# Patient Record
Sex: Male | Born: 1975 | Race: White | Hispanic: No | Marital: Married | Smoking: Never smoker
Health system: Southern US, Community
[De-identification: ages and names within clinical notes are randomized; demographics above are authoritative.]

## PROBLEM LIST (undated history)

## (undated) DIAGNOSIS — E785 Hyperlipidemia, unspecified: Secondary | ICD-10-CM

## (undated) DIAGNOSIS — Z8489 Family history of other specified conditions: Secondary | ICD-10-CM

## (undated) DIAGNOSIS — I639 Cerebral infarction, unspecified: Secondary | ICD-10-CM

## (undated) DIAGNOSIS — Q211 Atrial septal defect: Secondary | ICD-10-CM

---

## 2013-07-11 ENCOUNTER — Observation Stay (HOSPITAL_BASED_OUTPATIENT_CLINIC_OR_DEPARTMENT_OTHER): Payer: Commercial Managed Care - PPO

## 2013-07-11 ENCOUNTER — Observation Stay (HOSPITAL_BASED_OUTPATIENT_CLINIC_OR_DEPARTMENT_OTHER)
Admission: EM | Admit: 2013-07-11 | Discharge: 2013-07-12 | Disposition: A | Discharge status: Other Acute Inpatient Hospital | Payer: Commercial Managed Care - PPO | Attending: Emergency Medicine | Admitting: Emergency Medicine

## 2013-07-11 ENCOUNTER — Encounter (HOSPITAL_BASED_OUTPATIENT_CLINIC_OR_DEPARTMENT_OTHER): Payer: Self-pay | Admitting: Emergency Medicine

## 2013-07-11 ENCOUNTER — Emergency Department (HOSPITAL_BASED_OUTPATIENT_CLINIC_OR_DEPARTMENT_OTHER): Payer: Commercial Managed Care - PPO

## 2013-07-11 DIAGNOSIS — R5381 Other malaise: Secondary | ICD-10-CM | POA: Insufficient documentation

## 2013-07-11 DIAGNOSIS — R209 Unspecified disturbances of skin sensation: Secondary | ICD-10-CM | POA: Insufficient documentation

## 2013-07-11 DIAGNOSIS — R2990 Unspecified symptoms and signs involving the nervous system: Secondary | ICD-10-CM | POA: Diagnosis present

## 2013-07-11 DIAGNOSIS — R29818 Other symptoms and signs involving the nervous system: Secondary | ICD-10-CM | POA: Insufficient documentation

## 2013-07-11 DIAGNOSIS — R42 Dizziness and giddiness: Principal | ICD-10-CM | POA: Insufficient documentation

## 2013-07-11 LAB — CBC WITH DIFFERENTIAL/PLATELET
Basophils Absolute: 0 10*3/uL (ref 0.0–0.1)
Basophils Relative: 0 % (ref 0–1)
Eosinophils Absolute: 0.1 10*3/uL (ref 0.0–0.7)
Eosinophils Relative: 1 % (ref 0–5)
HCT: 48.5 % (ref 39.0–52.0)
Lymphocytes Relative: 20 % (ref 12–46)
Lymphs Abs: 2.6 10*3/uL (ref 0.7–4.0)
MCV: 85.2 fL (ref 78.0–100.0)
Monocytes Absolute: 1 10*3/uL (ref 0.1–1.0)
Platelets: 207 10*3/uL (ref 150–400)
RDW: 13.9 % (ref 11.5–15.5)
WBC: 12.9 10*3/uL — ABNORMAL HIGH (ref 4.0–10.5)

## 2013-07-11 LAB — URINALYSIS, ROUTINE W REFLEX MICROSCOPIC
Bilirubin Urine: NEGATIVE
Glucose, UA: NEGATIVE mg/dL
Hgb urine dipstick: NEGATIVE
Nitrite: NEGATIVE
Protein, ur: NEGATIVE mg/dL
Urobilinogen, UA: 1 mg/dL (ref 0.0–1.0)

## 2013-07-11 LAB — COMPREHENSIVE METABOLIC PANEL
ALT: 30 U/L (ref 0–53)
Albumin: 3.8 g/dL (ref 3.5–5.2)
CO2: 29 mEq/L (ref 19–32)
Calcium: 9.1 mg/dL (ref 8.4–10.5)
Creatinine, Ser: 1 mg/dL (ref 0.50–1.35)
GFR calc Af Amer: >90 mL/min (ref >90)
GFR calc non Af Amer: >90 mL/min (ref >90)
Glucose, Bld: 104 mg/dL — ABNORMAL HIGH (ref 70–99)
Sodium: 144 mEq/L (ref 135–145)

## 2013-07-11 MED ORDER — POTASSIUM CHLORIDE 20 MEQ/15ML (10%) PO LIQD
20.0000 meq | Freq: Once | ORAL | Status: AC
  Administered 2013-07-11: 20 meq via ORAL
  Filled 2013-07-11: qty 15

## 2013-07-11 MED ORDER — POTASSIUM CHLORIDE CRYS ER 20 MEQ PO TBCR
EXTENDED_RELEASE_TABLET | ORAL | Status: AC
  Filled 2013-07-11: qty 1

## 2013-07-11 NOTE — ED Notes (Signed)
Pt reports that he was dizzy-dx with vertigo yesterday at PCP.  Pt dehydrated-given of fluid.  Reports dizziness has continued.  Also states that his left arm and leg have been weak.  PERRLA.  Pt does have some difficulty with left arm ataxia. Grips equal.

## 2013-07-11 NOTE — ED Provider Notes (Signed)
CSN: [id number]     Arrival date & time 07/11/13  1453 History   First MD Initiated Contact with Patient 07/11/13 2024     Chief Complaint  Patient presents with  . Weakness   (Consider location/radiation/quality/duration/timing/severity/associated sxs/prior Treatment) Patient is a 37 y.o. male presenting with weakness. The history is provided by the patient.  Weakness This is a new problem. The current episode started today. The problem occurs constantly. The problem has been unchanged. Associated symptoms include fatigue, vertigo and weakness. Pertinent negatives include no abdominal pain, anorexia, change in bowel habit, chest pain, chills, congestion, coughing, diaphoresis, fever, headaches, joint swelling, myalgias, nausea (yesterday), neck pain, numbness, rash, sore throat, swollen glands, urinary symptoms or visual change. Vomiting: yesterday. The symptoms are aggravated by standing and walking.   Eder Macek is a 37 y.o. male who pesents to the ED with feeling weak. He went to his PCP yesterday with vertigo and vomiting. He was given IV fluids and Antivert. He was also prescribed Zofran for nausea. Started Zithromax today because his WBC was elevated yesterday. Patient's wife states that he has been unsteady today and she and her son had to help him in the shower and dress him because he was unable to do it himself.   History reviewed. No pertinent past medical history. History reviewed. No pertinent past surgical history. History reviewed. No pertinent family history. History  Substance Use Topics  . Smoking status: Never Smoker   . Smokeless tobacco: Not on file  . Alcohol Use: No    Review of Systems  Constitutional: Positive for fatigue. Negative for fever, chills, diaphoresis and appetite change.  HENT: Negative for congestion, ear pain and sore throat.   Eyes: Negative for photophobia, discharge, redness and itching.  Respiratory: Negative for cough.   Cardiovascular:  Negative for chest pain.  Gastrointestinal: Negative for nausea (yesterday), abdominal pain, anorexia and change in bowel habit. Vomiting: yesterday.  Genitourinary: Negative for dysuria, urgency and frequency.  Musculoskeletal: Negative for joint swelling, myalgias and neck pain.  Skin: Negative for rash.  Neurological: Positive for vertigo and weakness. Negative for numbness and headaches.  Psychiatric/Behavioral: Negative for confusion. The patient is not nervous/anxious.     Allergies  Review of patient's allergies indicates no known allergies.  Home Medications   Current Outpatient Rx  Name  Route  Sig  Dispense  Refill  . azithromycin (ZITHROMAX) 250 MG tablet   Oral   Take 250 mg by mouth daily.         . meclizine (ANTIVERT) 25 MG tablet   Oral   Take 25 mg by mouth 3 (three) times daily as needed for dizziness.         . ondansetron (ZOFRAN) 8 MG tablet   Oral   Take by mouth every 8 (eight) hours as needed for nausea or vomiting.          BP 127/79  Pulse 95  Temp(Src) 98.5 F (36.9 C) (Oral)  Resp 18  Ht 6\' 1"  (1.854 m)  Wt 254 lb (115.214 kg)  BMI 33.52 kg/m2  SpO2 98% Physical Exam  Nursing note and vitals reviewed. Constitutional: He is oriented to person, place, and time. He appears well-developed and well-nourished. No distress.  HENT:  Head: Normocephalic and atraumatic.  Right Ear: Tympanic membrane is not perforated and not erythematous.  Left Ear: Tympanic membrane is not perforated and not erythematous.  Nose: Nose normal.  Mouth/Throat: Uvula is midline, oropharynx is clear and moist and  mucous membranes are normal.  Eyes: Conjunctivae and EOM are normal. Pupils are equal, round, and reactive to light.  2 beats nystagmus with right gaze   Neck: Neck supple.  Cardiovascular: Normal rate and regular rhythm.   Pulmonary/Chest: Effort normal and breath sounds normal.  Abdominal: Soft. There is no tenderness.  Musculoskeletal: Normal range  of motion. He exhibits no edema.  Ataxia   Neurological: He is alert and oriented to person, place, and time. He has normal strength and normal reflexes. No cranial nerve deficit or sensory deficit. Coordination and gait abnormal.  Difficulty walking due to poor balance. Difficulty with rapid alternating movement with left hand.   Skin: Skin is warm and dry.  Psychiatric: He has a normal mood and affect. His behavior is normal. Thought content normal.    ED Course  Procedures (including critical care time) Labs Review Labs Reviewed - No data to display Imaging Review Ct Head Wo Contrast  07/11/2013   CLINICAL DATA:  Vertigo.  Left arm tingling and weakness for 36 hr.  EXAM: CT HEAD WITHOUT CONTRAST  TECHNIQUE: Contiguous axial images were obtained from the base of the skull through the vertex without intravenous contrast.  COMPARISON:  None.  FINDINGS: There is no evidence of acute intracranial abnormality including infarction, hemorrhage, mass lesion, mass effect, midline shift or abnormal extra-axial fluid collection. Cavum septum pellucidum et vergae eye is noted. Imaged paranasal sinuses and mastoid air cells are clear.  IMPRESSION: Negative exam.   Electronically Signed   By: Drusilla Kanner M.D.   On: 07/11/2013 15:52   Results for orders placed during the hospital encounter of 07/11/13 (from the past 24 hour(s))  CBC WITH DIFFERENTIAL     Status: Abnormal   Collection Time    07/11/13  8:45 PM      Result Value Range   WBC 12.9 (*) 4.0 - 10.5 K/uL   RBC 5.69  4.22 - 5.81 MIL/uL   Hemoglobin 16.2  13.0 - 17.0 g/dL   HCT 09.8  11.9 - 14.7 %   MCV 85.2  78.0 - 100.0 fL   MCH 28.5  26.0 - 34.0 pg   MCHC 33.4  30.0 - 36.0 g/dL   RDW 82.9  56.2 - 13.0 %   Platelets 207  150 - 400 K/uL   Neutrophils Relative % 71  43 - 77 %   Neutro Abs 9.1 (*) 1.7 - 7.7 K/uL   Lymphocytes Relative 20  12 - 46 %   Lymphs Abs 2.6  0.7 - 4.0 K/uL   Monocytes Relative 8  3 - 12 %   Monocytes  Absolute 1.0  0.1 - 1.0 K/uL   Eosinophils Relative 1  0 - 5 %   Eosinophils Absolute 0.1  0.0 - 0.7 K/uL   Basophils Relative 0  0 - 1 %   Basophils Absolute 0.0  0.0 - 0.1 K/uL  COMPREHENSIVE METABOLIC PANEL     Status: Abnormal   Collection Time    07/11/13  8:45 PM      Result Value Range   Sodium 144  135 - 145 mEq/L   Potassium 3.4 (*) 3.5 - 5.1 mEq/L   Chloride 105  96 - 112 mEq/L   CO2 29  19 - 32 mEq/L   Glucose, Bld 104 (*) 70 - 99 mg/dL   BUN 13  6 - 23 mg/dL   Creatinine, Ser 8.65  0.50 - 1.35 mg/dL   Calcium 9.1  8.4 -  10.5 mg/dL   Total Protein 7.3  6.0 - 8.3 g/dL   Albumin 3.8  3.5 - 5.2 g/dL   AST 19  0 - 37 U/L   ALT 30  0 - 53 U/L   Alkaline Phosphatase 66  39 - 117 U/L   Total Bilirubin 0.5  0.3 - 1.2 mg/dL   GFR calc non Af Amer >90  >90 mL/min   GFR calc Af Amer >90  >90 mL/min  URINALYSIS, ROUTINE W REFLEX MICROSCOPIC     Status: None   Collection Time    07/11/13  8:56 PM      Result Value Range   Color, Urine YELLOW  YELLOW   APPearance CLEAR  CLEAR   Specific Gravity, Urine 1.020  1.005 - 1.030   pH 6.5  5.0 - 8.0   Glucose, UA NEGATIVE  NEGATIVE mg/dL   Hgb urine dipstick NEGATIVE  NEGATIVE   Bilirubin Urine NEGATIVE  NEGATIVE   Ketones, ur NEGATIVE  NEGATIVE mg/dL   Protein, ur NEGATIVE  NEGATIVE mg/dL   Urobilinogen, UA 1.0  0.0 - 1.0 mg/dL   Nitrite NEGATIVE  NEGATIVE   Leukocytes, UA NEGATIVE  NEGATIVE    EKG Interpretation   None      Date/Time:  Sunday July 11 2013 21:28:25 EST Ventricular Rate:  87 PR Interval:  166 QRS Duration: 112 QT Interval:  366 QTC Calculation: 440 R Axis:   48 Text Interpretation:  Normal sinus rhythm Possible Left atrial enlargement Incomplete right bundle branch block Nonspecific T wave abnormality Abnormal ECG Confirmed by DOCHERTY  MD, MEGAN ([id number]) on 07/11/2013 10:03:10 PM   Discussed with accepting physician at [facility] patient clinical findings, labs, VS, and CT results, he accepts  the patient for transfer.   MDM  37 y.o. male with dizziness that started yesterday suddenly. Because of his neurological deficits he will be admitted for further evaluation. The patient and his family requested admission to [facility]. Initially the facility had declined to accept the patient due to no open bed. Later [facility] called back and they will accept the patient. [facility] called for transport. Patient stable for transport.   Janne Napoleon, [place] 07/12/13 [phone]

## 2013-07-11 NOTE — ED Notes (Signed)
Rounded again and apologized for delay,  Pt resting quietly with family present.  No needs were voiced.

## 2013-07-11 NOTE — ED Notes (Signed)
Pt was informed they were being transferred to [facility] for further evaluation.  They had requested to be transferred to [facility], however, census was high and they were not accepting admissions.  The patient/wife contacted some personal friends (Dr. Larina Bras and Dr. Haynes Dage) who notified an administrator at [facility] that they were requesting admission at that facility.  The nursing supervisor has facilitated admission and Mayer Camel, FNP will contact their admitting hospitalists for transfer.

## 2013-07-11 NOTE — ED Notes (Signed)
I was unable to transmit ECG taken earlier from wall monitor to secretaty. I completed a new ECG with portable unit and gave Dr. Micheline Maze copy.

## 2013-07-12 NOTE — ED Notes (Signed)
Patient to be expedited to [facility], will not wait here for Chest X ray requested by Dr. Selena Batten.  Report to [place], Charity fundraiser.  [facility] has assumed care of the patient at this time.

## 2013-07-13 NOTE — ED Provider Notes (Signed)
Medical screening examination/treatment/procedure(s) were conducted as a shared visit with non-physician practitioner(s) and myself.  I personally evaluated the patient during the encounter. Pt presents for dizziness, L sided clumsiness.  On PE, VSS< pt in NAD. He is found to have ataxia and post-pointing with L hand only.  Dix-Halpike negative.  Concern for posterior circulation CVA, or mass lesions.  CT head negative. Pt will be admitted to [facility] for further w/u.   EKG Interpretation    Date/Time:  Sunday July 11 2013 21:28:25 EST Ventricular Rate:  87 PR Interval:  166 QRS Duration: 112 QT Interval:  366 QTC Calculation: 440 R Axis:   48 Text Interpretation:  Normal sinus rhythm Possible Left atrial enlargement Incomplete right bundle branch block Nonspecific T wave abnormality Abnormal ECG Confirmed by DOCHERTY  MD, MEGAN ([id number]) on 07/11/2013 10:03:10 PM              Shanna Cisco, MD 07/13/13 1054

## 2015-08-16 HISTORY — PX: HERNIA REPAIR: SHX51

## 2016-12-06 ENCOUNTER — Emergency Department (HOSPITAL_BASED_OUTPATIENT_CLINIC_OR_DEPARTMENT_OTHER): Payer: Commercial Managed Care - PPO

## 2016-12-06 ENCOUNTER — Observation Stay (HOSPITAL_COMMUNITY): Payer: Commercial Managed Care - PPO

## 2016-12-06 ENCOUNTER — Encounter (HOSPITAL_BASED_OUTPATIENT_CLINIC_OR_DEPARTMENT_OTHER): Payer: Self-pay | Admitting: Emergency Medicine

## 2016-12-06 ENCOUNTER — Observation Stay (HOSPITAL_BASED_OUTPATIENT_CLINIC_OR_DEPARTMENT_OTHER)
Admission: EM | Admit: 2016-12-06 | Discharge: 2016-12-08 | Disposition: A | Discharge status: Home / Self Care | Payer: Commercial Managed Care - PPO | Attending: Internal Medicine | Admitting: Internal Medicine

## 2016-12-06 DIAGNOSIS — G451 Carotid artery syndrome (hemispheric): Secondary | ICD-10-CM | POA: Diagnosis not present

## 2016-12-06 DIAGNOSIS — Z8673 Personal history of transient ischemic attack (TIA), and cerebral infarction without residual deficits: Secondary | ICD-10-CM | POA: Insufficient documentation

## 2016-12-06 DIAGNOSIS — Z6833 Body mass index (BMI) 33.0-33.9, adult: Secondary | ICD-10-CM | POA: Insufficient documentation

## 2016-12-06 DIAGNOSIS — G459 Transient cerebral ischemic attack, unspecified: Secondary | ICD-10-CM | POA: Diagnosis not present

## 2016-12-06 DIAGNOSIS — I10 Essential (primary) hypertension: Secondary | ICD-10-CM | POA: Insufficient documentation

## 2016-12-06 DIAGNOSIS — E785 Hyperlipidemia, unspecified: Secondary | ICD-10-CM | POA: Diagnosis not present

## 2016-12-06 DIAGNOSIS — Z79899 Other long term (current) drug therapy: Secondary | ICD-10-CM | POA: Diagnosis not present

## 2016-12-06 DIAGNOSIS — R200 Anesthesia of skin: Secondary | ICD-10-CM

## 2016-12-06 DIAGNOSIS — I639 Cerebral infarction, unspecified: Secondary | ICD-10-CM

## 2016-12-06 DIAGNOSIS — Z7982 Long term (current) use of aspirin: Secondary | ICD-10-CM | POA: Diagnosis not present

## 2016-12-06 DIAGNOSIS — R270 Ataxia, unspecified: Secondary | ICD-10-CM | POA: Insufficient documentation

## 2016-12-06 DIAGNOSIS — E669 Obesity, unspecified: Secondary | ICD-10-CM | POA: Insufficient documentation

## 2016-12-06 DIAGNOSIS — Z7902 Long term (current) use of antithrombotics/antiplatelets: Secondary | ICD-10-CM | POA: Diagnosis not present

## 2016-12-06 DIAGNOSIS — R2990 Unspecified symptoms and signs involving the nervous system: Secondary | ICD-10-CM | POA: Diagnosis present

## 2016-12-06 HISTORY — DX: Family history of other specified conditions: Z84.89

## 2016-12-06 HISTORY — DX: Cerebral infarction, unspecified: I63.9

## 2016-12-06 HISTORY — DX: Hyperlipidemia, unspecified: E78.5

## 2016-12-06 LAB — CBC
HCT: 44.8 % (ref 39.0–52.0)
HEMATOCRIT: 45.7 % (ref 39.0–52.0)
HEMOGLOBIN: 15.4 g/dL (ref 13.0–17.0)
Hemoglobin: 15.3 g/dL (ref 13.0–17.0)
MCH: 29.1 pg (ref 26.0–34.0)
MCH: 28.6 pg (ref 26.0–34.0)
MCHC: 34.4 g/dL (ref 30.0–36.0)
MCHC: 33.5 g/dL (ref 30.0–36.0)
MCV: 84.7 fL (ref 78.0–100.0)
MCV: 85.4 fL (ref 78.0–100.0)
PLATELETS: 177 10*3/uL (ref 150–400)
Platelets: 174 10*3/uL (ref 150–400)
RBC: 5.29 MIL/uL (ref 4.22–5.81)
RBC: 5.35 MIL/uL (ref 4.22–5.81)
RDW: 13.5 % (ref 11.5–15.5)
RDW: 13.3 % (ref 11.5–15.5)
WBC: 6.7 10*3/uL (ref 4.0–10.5)
WBC: 15.8 10*3/uL — ABNORMAL HIGH (ref 4.0–10.5)

## 2016-12-06 LAB — DIFFERENTIAL
BASOS ABS: 0 10*3/uL (ref 0.0–0.1)
Basophils Relative: 0 %
EOS ABS: 0.3 10*3/uL (ref 0.0–0.7)
EOS PCT: 4 %
Lymphocytes Relative: 25 %
Lymphs Abs: 1.7 10*3/uL (ref 0.7–4.0)
Monocytes Absolute: 0.6 10*3/uL (ref 0.1–1.0)
Monocytes Relative: 10 %
NEUTROS PCT: 61 %
Neutro Abs: 4.1 10*3/uL (ref 1.7–7.7)

## 2016-12-06 LAB — URINALYSIS, ROUTINE W REFLEX MICROSCOPIC
BILIRUBIN URINE: NEGATIVE
Glucose, UA: NEGATIVE mg/dL
HGB URINE DIPSTICK: NEGATIVE
KETONES UR: NEGATIVE mg/dL
Leukocytes, UA: NEGATIVE
Nitrite: NEGATIVE
Protein, ur: NEGATIVE mg/dL
SPECIFIC GRAVITY, URINE: 1.005 (ref 1.005–1.030)
pH: 6 (ref 5.0–8.0)

## 2016-12-06 LAB — COMPREHENSIVE METABOLIC PANEL
ALBUMIN: 4.3 g/dL (ref 3.5–5.0)
ALT: 42 U/L (ref 17–63)
ANION GAP: 9 (ref 5–15)
AST: 32 U/L (ref 15–41)
Alkaline Phosphatase: 78 U/L (ref 38–126)
BUN: 14 mg/dL (ref 6–20)
CHLORIDE: 105 mmol/L (ref 101–111)
CO2: 26 mmol/L (ref 22–32)
Calcium: 8.8 mg/dL — ABNORMAL LOW (ref 8.9–10.3)
Creatinine, Ser: 0.96 mg/dL (ref 0.61–1.24)
GFR calc Af Amer: >60 mL/min (ref >60)
GFR calc non Af Amer: >60 mL/min (ref >60)
GLUCOSE: 108 mg/dL — AB (ref 65–99)
POTASSIUM: 3.6 mmol/L (ref 3.5–5.1)
SODIUM: 140 mmol/L (ref 135–145)
TOTAL PROTEIN: 7.2 g/dL (ref 6.5–8.1)
Total Bilirubin: 0.8 mg/dL (ref 0.3–1.2)

## 2016-12-06 LAB — RAPID URINE DRUG SCREEN, HOSP PERFORMED
AMPHETAMINES: NOT DETECTED
BARBITURATES: NOT DETECTED
BENZODIAZEPINES: NOT DETECTED
Cocaine: NOT DETECTED
Opiates: NOT DETECTED
TETRAHYDROCANNABINOL: NOT DETECTED

## 2016-12-06 LAB — TROPONIN I: Troponin I: <0.03 ng/mL (ref <0.03)

## 2016-12-06 LAB — CREATININE, SERUM
CREATININE: 0.88 mg/dL (ref 0.61–1.24)
GFR calc Af Amer: >60 mL/min (ref >60)

## 2016-12-06 LAB — CBG MONITORING, ED: Glucose-Capillary: 103 mg/dL — ABNORMAL HIGH (ref 65–99)

## 2016-12-06 LAB — PROTIME-INR
INR: 0.95
PROTHROMBIN TIME: 12.7 s (ref 11.4–15.2)

## 2016-12-06 LAB — ETHANOL: Alcohol, Ethyl (B): <5 mg/dL (ref <5)

## 2016-12-06 LAB — APTT: APTT: 26 s (ref 24–36)

## 2016-12-06 MED ORDER — HYDRALAZINE HCL 20 MG/ML IJ SOLN: 10.0000 mg | Freq: Three times a day (TID) | INTRAMUSCULAR | Status: DC | PRN

## 2016-12-06 MED ORDER — CLOPIDOGREL BISULFATE 75 MG PO TABS
75.0000 mg | ORAL_TABLET | Freq: Every day | ORAL | Status: DC
  Administered 2016-12-07 – 2016-12-08 (×2): 75 mg via ORAL
  Filled 2016-12-06 (×2): qty 1

## 2016-12-06 MED ORDER — ATORVASTATIN CALCIUM 40 MG PO TABS
40.0000 mg | ORAL_TABLET | Freq: Every day | ORAL | Status: DC
  Administered 2016-12-06 – 2016-12-07 (×2): 40 mg via ORAL
  Filled 2016-12-06 (×2): qty 1

## 2016-12-06 MED ORDER — CLOPIDOGREL BISULFATE 75 MG PO TABS: 300.0000 mg | ORAL_TABLET | Freq: Once | ORAL | Status: DC

## 2016-12-06 MED ORDER — SENNOSIDES-DOCUSATE SODIUM 8.6-50 MG PO TABS: 1.0000 | ORAL_TABLET | Freq: Every evening | ORAL | Status: DC | PRN

## 2016-12-06 MED ORDER — STROKE: EARLY STAGES OF RECOVERY BOOK
Freq: Once | Status: DC
Start: 2016-12-06 — End: 2016-12-08

## 2016-12-06 MED ORDER — ONDANSETRON HCL 4 MG/2ML IJ SOLN
4.0000 mg | Freq: Three times a day (TID) | INTRAMUSCULAR | Status: DC | PRN
  Administered 2016-12-06: 4 mg via INTRAVENOUS
  Filled 2016-12-06: qty 2

## 2016-12-06 MED ORDER — ASPIRIN EC 325 MG PO TBEC: 325.0000 mg | DELAYED_RELEASE_TABLET | Freq: Every day | ORAL | Status: DC

## 2016-12-06 MED ORDER — ACETAMINOPHEN 160 MG/5ML PO SOLN: 650.0000 mg | ORAL | Status: DC | PRN

## 2016-12-06 MED ORDER — ACETAMINOPHEN 325 MG PO TABS
650.0000 mg | ORAL_TABLET | ORAL | Status: DC | PRN
  Administered 2016-12-07: 650 mg via ORAL
  Filled 2016-12-06: qty 2

## 2016-12-06 MED ORDER — HEPARIN SODIUM (PORCINE) 5000 UNIT/ML IJ SOLN
5000.0000 [IU] | Freq: Three times a day (TID) | INTRAMUSCULAR | Status: DC
  Administered 2016-12-06 – 2016-12-08 (×5): 5000 [IU] via SUBCUTANEOUS
  Filled 2016-12-06 (×5): qty 1

## 2016-12-06 MED ORDER — IOPAMIDOL (ISOVUE-370) INJECTION 76%
INTRAVENOUS | Status: AC
  Administered 2016-12-06: 50 mL
  Filled 2016-12-06: qty 50

## 2016-12-06 MED ORDER — LORAZEPAM 2 MG/ML IJ SOLN
1.0000 mg | Freq: Once | INTRAMUSCULAR | Status: AC | PRN
  Administered 2016-12-07: 1 mg via INTRAVENOUS
  Filled 2016-12-06: qty 1

## 2016-12-06 MED ORDER — MECLIZINE HCL 12.5 MG PO TABS
50.0000 mg | ORAL_TABLET | Freq: Three times a day (TID) | ORAL | Status: DC | PRN
  Administered 2016-12-06: 50 mg via ORAL
  Filled 2016-12-06: qty 4

## 2016-12-06 MED ORDER — ACETAMINOPHEN 650 MG RE SUPP: 650.0000 mg | RECTAL | Status: DC | PRN

## 2016-12-06 MED ORDER — ACETAMINOPHEN 325 MG PO TABS
650.0000 mg | ORAL_TABLET | Freq: Once | ORAL | Status: AC
  Administered 2016-12-06: 650 mg via ORAL
  Filled 2016-12-06: qty 2

## 2016-12-06 MED ORDER — SODIUM CHLORIDE 0.9 % IV SOLN: INTRAVENOUS | Status: DC

## 2016-12-06 MED ORDER — PROCHLORPERAZINE EDISYLATE 5 MG/ML IJ SOLN
5.0000 mg | Freq: Two times a day (BID) | INTRAMUSCULAR | Status: DC | PRN
  Administered 2016-12-06: 5 mg via INTRAVENOUS
  Filled 2016-12-06: qty 2

## 2016-12-06 NOTE — Progress Notes (Signed)
PT Cancellation Note  Patient Details Name: Cody Guerrero MRN: [id number] DOB: 04/08/1976   Cancelled Treatment:    Reason Eval/Treat Not Completed: Other (comment). MD currently performing assessment in pt's room and admissions RN waiting outside of pt's room as well. PT will continue to f/u with pt as available.    Alessandra BevelsJennifer M Solimar Maiden 12/06/2016, 4:47 PM

## 2016-12-06 NOTE — ED Notes (Signed)
EKG done due to dizziness and L arm numbness.

## 2016-12-06 NOTE — ED Triage Notes (Signed)
Pt started having dizziness started 1035.  Pt states he felt weakness.  Noticed struggling keeping up left sided extremities.  Pt has decreased sensation on left.

## 2016-12-06 NOTE — ED Provider Notes (Signed)
[facility] Note   CSN: [id number] Arrival date & time: 12/06/16  1119     History   Chief Complaint Chief Complaint  Patient presents with  . Dizziness    HPI Dyke BrackettJamie Bigos is a 41 y.o. male.  The history is provided by the patient. No language interpreter was used.  Dizziness    Dyke BrackettJamie Aber is a 41 y.o. male who presents to the Emergency Department complaining of numbness.  Starting at 1035 today he noticed that he was feeling lightheaded as if he might pass out. He has associated numbness/tingling as well as weakness in his left hand. He knows he was having difficulty holding his phone in his left hand. No chest pain, shortness of breath, bowel pain, nausea, vomiting, headache. He does have a history of prior CVA 3-1/2 years ago and his deficits were on his right side at that time.  Past Medical History:  Diagnosis Date  . Hyperlipemia   . Stroke [facility](HCC)     Patient Active Problem List   Diagnosis Date Noted  . Stroke-like symptom 07/11/2013    No past surgical history on file.     Home Medications    Prior to Admission medications   Medication Sig Start Date End Date Taking? Authorizing Provider  aspirin 325 MG EC tablet Take 325 mg by mouth daily.   Yes [provider]  atorvastatin (LIPITOR) 40 MG tablet Take 40 mg by mouth daily.   Yes [provider]  b complex vitamins tablet Take 1 tablet by mouth daily.   Yes [provider]  Omega-3 Fatty Acids (FISH OIL) 1000 MG CAPS Take 1 capsule by mouth daily.   Yes [provider]  meclizine (ANTIVERT) 25 MG tablet Take 25 mg by mouth 3 (three) times daily as needed for dizziness.    [provider]    Family History No family history on file.  Social History Social History  Substance Use Topics  . Smoking status: Never Smoker  . Smokeless tobacco: Never Used  . Alcohol use No     Allergies   Patient has no known allergies.   Review of  Systems Review of Systems  Neurological: Positive for dizziness.  All other systems reviewed and are negative.    Physical Exam Updated Vital Signs There were no vitals taken for this visit.  Physical Exam  Constitutional: He is oriented to person, place, and time. He appears well-developed and well-nourished.  HENT:  Head: Normocephalic and atraumatic.  Cardiovascular: Normal rate and regular rhythm.   No murmur heard. Pulmonary/Chest: Effort normal and breath sounds normal. No respiratory distress.  Abdominal: Soft. There is no tenderness. There is no rebound and no guarding.  Musculoskeletal: He exhibits no edema or tenderness.  Neurological: He is alert and oriented to person, place, and time.  Sensation to light touch intact throughout the face. No facial asymmetry. EOMI. 5 out of 5 strength in all 4 extremities. No pronator drift. No ataxia on finger to nose bilaterally. Sensation to light touch intact in all 4 extremities but altered sensation to the left hand.  Skin: Skin is warm and dry.  Psychiatric: He has a normal mood and affect. His behavior is normal.  Nursing note and vitals reviewed.    ED Treatments / Results  Labs (all labs ordered are listed, but only abnormal results are displayed) Labs Reviewed  CBG MONITORING, ED - Abnormal; Notable for the following:       Result  Value   Glucose-Capillary 103 (*)    All other components within normal limits  ETHANOL  PROTIME-INR  APTT  CBC  DIFFERENTIAL  COMPREHENSIVE METABOLIC PANEL  RAPID URINE DRUG SCREEN, HOSP PERFORMED  URINALYSIS, ROUTINE W REFLEX MICROSCOPIC  TROPONIN I    EKG  EKG Interpretation  Date/Time:  Friday Dec 06 2016 11:32:07 EDT Ventricular Rate:  62 PR Interval:    QRS Duration: 129 QT Interval:  428 QTC Calculation: 435 R Axis:   68 Text Interpretation:  Sinus rhythm IVCD, consider atypical RBBB Confirmed by Lincoln Brigham [phone]) on 12/06/2016 11:53:46 AM       Radiology No results  found.  Procedures Procedures (including critical care time)  Medications Ordered in ED Medications - No data to display   Initial Impression / Assessment and Plan / ED Course  I have reviewed the triage vital signs and the nursing notes.  Pertinent labs & imaging results that were available during my care of the patient were reviewed by me and considered in my medical decision making (see chart for details).     Patient with history of prior CVA here with lightheadedness, weakness and numbness in the left hand. NIH stroke scale of 1 due to decreased sensation to the left hand. Discussed with neurologist, given his low stroke scale will not activate code stroke at this time. Initial CT scan is negative for acute infarct. Hospitalist consulted for admission for further treatment. Discussed with patient findings of studies and recommendation for treatment and he is in agreement with plan.  Final Clinical Impressions(s) / ED Diagnoses   Final diagnoses:  None    New Prescriptions New Prescriptions   No medications on file     Tilden Fossa, MD 12/07/16 [phone]

## 2016-12-06 NOTE — Progress Notes (Signed)
New Admission Note:  Arrival Method: via [facility]  Mental Orientation: Alert & oriented x 4 Telemetry: 165m16 Assessment: Completed Skin: assessment  IV: n/a Pain: 0/10 Safety Measures: Safety Fall Prevention Plan was given, discussed. Admission: Completed 57M: Patient has been orientated to the room, unit and the staff. Family: wife at bedside   Orders have been reviewed and implemented. Will continue to monitor the patient. Call light has been placed within reach and bed alarm has been activated.   Lawernce IonYari Keison Glendinning ,RN

## 2016-12-06 NOTE — Plan of Care (Signed)
41 year old male  CC: sudden onset L arm weakness and numbness  ED Course: NIH 1, on EDP exam abn sensation only, EDP not formally consulted but recommended MRI Advised EDP: ASA if pt has not taken today.  PMHx: Ischemic stroke, hyperlipidemia  To do: MRI  Status: Telemetry, observation  Haydee SalterPhillip M Hobbs MD

## 2016-12-06 NOTE — Consult Note (Signed)
Neurology Consultation Reason for Consult: Left-sided ataxia Referring Physician: Laurena SpiesHobbs, P  CC: Left-sided ataxia  History is obtained from: Patient  HPI: Dyke BrackettJamie Devincenzi is a 41 y.o. male with a history of previous stroke presents with dizziness and left-sided ataxia that started around 10:30 AM. He presented a [place] [facility], and at that time had mild symptoms, he was evaluated  And found to have an NIH stroke scale of 1. I discussed with the treating physician at that time, and given that he did not have a disabling deficit the decision to not pursue TPA was made. He states that his dizziness got significantly worse around 3 PM, which time he is being [facility]. Of note, when he was evaluated [place], he received 1 point for sensation, but his sensory symptoms have since resolved.  LKW: 10:30 AM tpa given?: no, mild symptoms Premorbid modified rankin scale: 0   ROS: A 14 point ROS was performed and is negative except as noted in the HPI.   Past Medical History:  Diagnosis Date  . Family history of adverse reaction to anesthesia    MOTHER  . Hyperlipemia   . Stroke [facility](HCC)      History reviewed. No pertinent family history.   Social History:  reports that he has never smoked. He has never used smokeless tobacco. He reports that he does not drink alcohol or use drugs.   Exam: Current vital signs: BP 121/73 (BP Location: Right Arm)   Pulse 71   Temp 97.6 F (36.4 C) (Oral)   Resp 13   Ht 6\' 1"  (1.854 m)   Wt 113.9 kg (251 lb)   SpO2 96%   BMI 33.12 kg/m  Vital signs in last 24 hours: Temp:  [97.6 F (36.4 C)-97.7 F (36.5 C)] 97.6 F (36.4 C) (05/25 1510) Pulse Rate:  [62-84] 71 (05/25 1510) Resp:  [10-20] 13 (05/25 1400) BP: (119-134)/(70-113) 121/73 (05/25 1510) SpO2:  [96 %-100 %] 96 % (05/25 1510) Weight:  [113.9 kg (251 lb)] 113.9 kg (251 lb) (05/25 1510)   Physical Exam  Constitutional: Appears well-developed and well-nourished.   Psych: Affect appropriate to situation Eyes: No scleral injection HENT: No OP obstrucion Head: Normocephalic.  Cardiovascular: Normal rate and regular rhythm.  Respiratory: Effort normal and breath sounds normal to anterior ascultation GI: Soft.  No distension. There is no tenderness.  Skin: WDI  Neuro: Mental Status: Patient is awake, alert, oriented to person, place, month, year, and situation. Patient is able to give a clear and coherent history. No signs of aphasia or neglect Cranial Nerves: II: Visual Fields are full. Pupils are equal, round, and reactive to light.   III,IV, VI: EOMI without ptosis or diploplia. He does have nystagmus that is more prominent on leftward gaze V: Facial sensation is symmetric to temperature VII: Facial movement is symmetric.  VIII: hearing is intact to voice X: Uvula elevates symmetrically XI: Shoulder shrug is symmetric. XII: tongue is midline without atrophy or fasciculations.  Motor: Tone is normal. Bulk is normal. 5/5 strength was present on the right, he does have mild left leg weakness without drift. Sensory: Sensation is symmetric to light touch and temperature in the arms and legs. Deep Tendon Reflexes: 2+ and symmetric in the biceps and patellae.  Plantars: Toes are downgoing bilaterally.  Cerebellar: FNF and HKS are intact on the right, decreased on the left   I have reviewed labs in epic and the results pertinent to this consultation are:  CMP-unremarkable  I have reviewed the images obtained: CT head-negative  Impression: 41 year old male with new onset ataxia most consistent with small ischemic stroke. He will need further workup for secondary risk factor modification. Given that he appears to be progressively worsening, will load with Plavix.  Recommendations: 1. HgbA1c, fasting lipid panel 2. MRI  of the brain without contrast 3. Frequent neuro checks 4. Echocardiogram 5. CTA head and neck 6. Prophylactic  therapy-Antiplatelet med: Plavix 75 mg daily following 300mg  load 7. Risk factor modification 8. Telemetry monitoring 9. PT consult, OT consult, Speech consult 10. please page stroke NP  Or  PA  Or MD  from 8am -4 pm as this patient will be followed by the stroke team at this point.   You can look them up on [email or web address]      Ritta Slot, MD [facility] [phone]  If 7pm- 7am, please page neurology on call as listed in AMION.

## 2016-12-06 NOTE — H&P (Addendum)
[facility] History and Physical  Cody Guerrero WJX:[id number] DOB: 09/07/1975 DOA: 12/06/2016  Referring physician:  PCP: Cody Guerrero, Cody Guerrero, Cody Guerrero   Chief Complaint: "It just hit me all of a sudden and the room started to spin."  HPI: Cody Guerrero is a 41 y.o. male  with past medical history of stroke, hyperlipidemia, vertigo and migraines presents with complaint of nausea vomiting and dizziness. Patient states that he was standing at about 10:30 AM the morning of admission had sudden onset of dizziness with mild headache. Patient denies having any symptoms prior to this. Last night was normal. No recent illness. No sick contacts. No head trauma. Headache and dizziness were followed by some nausea. Patient then noticed that when doing no activities had differently with his left arm which began to feel heavy and numb. Patient did take 324 mg of aspirin today. Made his way to the emergency room for evaluation.  Denies fever, chills, chest pain, sob, diarrhea.   Review of Systems:  As per HPI otherwise 10 point review of systems negative.    Past Medical History:  Diagnosis Date  . Hyperlipemia   . Stroke [facility](HCC)    No past surgical history on file. Social History:  reports that he has never smoked. He has never used smokeless tobacco. He reports that he does not drink alcohol or use drugs.  No Known Allergies  No family history on file.   Prior to Admission medications   Medication Sig Start Date End Date Taking? Authorizing Provider  aspirin 325 MG EC tablet Take 325 mg by mouth daily.   Yes Provider, Historical, Cody Guerrero  atorvastatin (LIPITOR) 40 MG tablet Take 40 mg by mouth daily.   Yes Provider, Historical, Cody Guerrero  b complex vitamins tablet Take 1 tablet by mouth daily.   Yes Provider, Historical, Cody Guerrero  meclizine (ANTIVERT) 25 MG tablet Take 50 mg by mouth 3 (three) times daily as needed for dizziness.    Yes Provider, Historical, Cody Guerrero  Omega-3 Fatty Acids (FISH OIL) 1000 MG CAPS Take 1 capsule  by mouth daily.   Yes Provider, Historical, Cody Guerrero   Physical Exam: Vitals:   12/06/16 1330 12/06/16 1345 12/06/16 1400 12/06/16 1510  BP: 123/74 126/80 119/78 121/73  Pulse: 77 74 77 71  Resp: 12 10 13    Temp:    97.6 F (36.4 C)  TempSrc:    Oral  SpO2: 99% 98% 99% 96%  Weight:    113.9 kg (251 lb)  Height:    6\' 1"  (1.854 m)    Wt Readings from Last 3 Encounters:  12/06/16 113.9 kg (251 lb)  07/11/13 115.2 kg (254 lb)    General:  Appears calm and comfortable; A&Ox3 Eyes:  PERRL, EOMI, normal lids, iris; L eye nystagmus ENT:  grossly normal hearing, lips & tongue Neck:  no LAD, masses or thyromegaly Cardiovascular:  RRR, no m/r/g. No LE edema.  Respiratory:  CTA bilaterally, no w/r/r. Normal respiratory effort. Abdomen:  soft, ntnd Skin:  no rash or induration seen on limited exam Musculoskeletal:  grossly normal tone BUE/BLE Psychiatric:  grossly normal mood and affect, speech fluent and appropriate Neurologic:  CN 2, 5, 7-12 grossly intact, moves all extremities in coordinated fashion. Abn FNF with LUE          Labs on Admission:  Basic Metabolic Panel:  Recent Labs Lab 12/06/16 1140  NA 140  K 3.6  CL 105  CO2 26  GLUCOSE 108*  BUN 14  CREATININE 0.96  CALCIUM 8.8*   Liver Function Tests:  Recent Labs Lab 12/06/16 1140  AST 32  ALT 42  ALKPHOS 78  BILITOT 0.8  PROT 7.2  ALBUMIN 4.3   No results for input(Guerrero): LIPASE, AMYLASE in the last 168 hours. No results for input(Guerrero): AMMONIA in the last 168 hours. CBC:  Recent Labs Lab 12/06/16 1140  WBC 6.7  NEUTROABS 4.1  HGB 15.4  HCT 44.8  MCV 84.7  PLT 177   Cardiac Enzymes:  Recent Labs Lab 12/06/16 1140  TROPONINI <0.03    BNP (last 3 results) No results for input(Guerrero): BNP in the last 8760 hours.  ProBNP (last 3 results) No results for input(Guerrero): PROBNP in the last 8760 hours.   Serum creatinine: 0.96 mg/dL 16/10/96 0454 Estimated creatinine clearance: 135.3  mL/min  CBG:  Recent Labs Lab 12/06/16 1146  GLUCAP 103*    Radiological Exams on Admission: Ct Head Wo Contrast  Result Date: 12/06/2016 CLINICAL DATA:  Pt started having dizziness started 1035. Pt states he felt weakness. Noticed struggling keeping up left sided extremities. Pt has decreased sensation on left. Hx of stroke 3.5 years ago, hyperlipidemia EXAM: CT HEAD WITHOUT CONTRAST TECHNIQUE: Contiguous axial images were obtained from the base of the skull through the vertex without intravenous contrast. COMPARISON:  07/11/2013 FINDINGS: Brain: No evidence of acute infarction, hemorrhage, hydrocephalus, extra-axial collection or mass lesion/mass effect. Vascular: No hyperdense vessel or unexpected calcification. Skull: Normal. Negative for fracture or focal lesion. Sinuses/Orbits: Patchy mucosal disease in the sphenoid sinuses. Otherwise negative. Other: None. IMPRESSION: 1. Negative for bleed or other acute intracranial process. 2. Ethmoid sinus disease. Electronically Signed   By: Corlis Leak M.D.   On: 12/06/2016 12:17    EKG: Independently reviewed. No STEMI  Assessment/Plan Active Problems:   Stroke-like episode (HCC)   Stroke LUE ataxia CT head negative for acute bleed MRI ordered Aspirin full dose  A1c ordered Lipid panel ordered Admitted to telemetry bed Echo ordered Carotid Dopplers ordered MRA switched to CTA by neuro Stroke team to follow patient ED stated exam normal except for sensation in LUE due to differences in our exam I contacted the stroke team who will eval patient  N/V zofran and compazine IVF  Code Status: FULL  DVT Prophylaxis: heparin Family Communication: wife at bedside Disposition Plan: Pending Improvement  Status: tele, obs  Cody Guerrero, [facility] [email or web address] Password [email or web address]

## 2016-12-07 ENCOUNTER — Observation Stay (HOSPITAL_BASED_OUTPATIENT_CLINIC_OR_DEPARTMENT_OTHER): Payer: Commercial Managed Care - PPO

## 2016-12-07 ENCOUNTER — Other Ambulatory Visit (HOSPITAL_COMMUNITY): Payer: Commercial Managed Care - PPO

## 2016-12-07 ENCOUNTER — Observation Stay (HOSPITAL_COMMUNITY): Payer: Commercial Managed Care - PPO

## 2016-12-07 DIAGNOSIS — G459 Transient cerebral ischemic attack, unspecified: Secondary | ICD-10-CM | POA: Diagnosis not present

## 2016-12-07 DIAGNOSIS — I639 Cerebral infarction, unspecified: Secondary | ICD-10-CM | POA: Diagnosis not present

## 2016-12-07 DIAGNOSIS — I6350 Cerebral infarction due to unspecified occlusion or stenosis of unspecified cerebral artery: Secondary | ICD-10-CM | POA: Diagnosis not present

## 2016-12-07 LAB — HIV ANTIBODY (ROUTINE TESTING W REFLEX): HIV SCREEN 4TH GENERATION: NONREACTIVE

## 2016-12-07 LAB — LIPID PANEL
Cholesterol: 130 mg/dL (ref 0–200)
HDL: 43 mg/dL (ref >40)
LDL CALC: 67 mg/dL (ref 0–99)
Total CHOL/HDL Ratio: 3 RATIO
Triglycerides: 98 mg/dL (ref <150)
VLDL: 20 mg/dL (ref 0–40)

## 2016-12-07 NOTE — Evaluation (Signed)
Occupational Therapy Evaluation Patient Details Name: Cody BrackettJamie Guerrero MRN: [id number] DOB: 12/04/1975 Today's Date: 12/07/2016    History of Present Illness Pt is a 41 y.o. male who presented to the ED with sudden onset of dizziness and L sided ataxia. [facility]. Pt has a PMH significant for stroke, hyperlipidemia, vertigo, and migraines. MRI pending.   Clinical Impression   PTA, pt was independent with ADL, IADL, and functional mobility and was working full time. Pt currently requires overall supervision for safety for basic ADL tasks in hospital setting. Pt does present with decreased L UE strength, coordination and sensation impacting ability to participate in IADL and work related tasks independently at this time. Initiated HEP for L UE strengthening and improved fine motor coordination and pt demonstrates understanding (see below for further details). Pt/family additionally educated concerning compensatory strategies for decreased sensation for light touch and temperature in L UE. They would benefit from further reinforcement to ensure incorporation into daily routine. Recommend outpatient OT services post-acute D/C in order to maximize independence with IADL and ensure successful return to work. Will continue to follow while admitted.     Follow Up Recommendations  Outpatient OT (Neuro-outpatient OT)    Equipment Recommendations  None recommended by OT    Recommendations for Other Services       Precautions / Restrictions Precautions Precautions: None Restrictions Weight Bearing Restrictions: No      Mobility Bed Mobility Overal bed mobility: Independent                Transfers Overall transfer level: Modified independent                    Balance Overall balance assessment: Needs assistance Sitting-balance support: Feet supported;No upper extremity supported Sitting balance-Leahy Scale: Normal      Standing balance support: During functional activity;No upper extremity supported Standing balance-Leahy Scale: Good                   Standardized Balance Assessment Standardized Balance Assessment : Dynamic Gait Index   Dynamic Gait Index Level Surface: Mild Impairment Change in Gait Speed: Normal Gait with Horizontal Head Turns: Mild Impairment Gait with Vertical Head Turns: Normal Gait and Pivot Turn: Normal Step Over Obstacle: Normal Step Around Obstacles: Normal Steps: Normal Total Score: 22     ADL either performed or assessed with clinical judgement   ADL Overall ADL's : Needs assistance/impaired                                       General ADL Comments: Pt requiring supervision for safety overall with basic ADL. Fine motor and strength related aspects of work related tasks most impacted by impairments.      Vision Patient Visual Report:  (blurry and diplopia yesterday but this has resolved) Vision Assessment?: Yes Eye Alignment: Within Functional Limits Ocular Range of Motion: Within Functional Limits Alignment/Gaze Preference: Within Defined Limits Tracking/Visual Pursuits: Able to track stimulus in all quads without difficulty Saccades: Within functional limits Convergence: Within functional limits Visual Fields: No apparent deficits Diplopia Assessment:  (resolved) Additional Comments: No visual deficits noted at time of evaluation. Reports diplopia and blurred vision in L eye yesterday but that this is resolved.     Perception     Praxis Praxis Praxis tested?: Within functional limits    Pertinent  Vitals/Pain Pain Assessment: No/denies pain     Hand Dominance Right   Extremity/Trunk Assessment Upper Extremity Assessment Upper Extremity Assessment: LUE deficits/detail LUE Deficits / Details: Shoulder forward flexion 4/5 strength, elbow flexion 4+/5, elbow extension 4+/5, and grasp 4/5. Pt with decreased fine motor  coordination and slightly slow with finger to nose testing.  LUE Sensation: decreased light touch (Decreased hot/cold sensation) LUE Coordination: decreased fine motor;decreased gross motor   Lower Extremity Assessment Lower Extremity Assessment: Defer to PT evaluation   Cervical / Trunk Assessment Cervical / Trunk Assessment: Normal   Communication Communication Communication: No difficulties   Cognition Arousal/Alertness: Awake/alert Behavior During Therapy: WFL for tasks assessed/performed Overall Cognitive Status: Within Functional Limits for tasks assessed                                     General Comments       Exercises Exercises: General Upper Extremity;Other exercises General Exercises - Upper Extremity Shoulder Flexion: Strengthening;Left;10 reps;Theraband Theraband Level (Shoulder Flexion): Level 3 (Green) Elbow Flexion: Strengthening;Left;10 reps;Theraband Theraband Level (Elbow Flexion): Level 3 (Green) Digit Composite Flexion: Strengthening;Squeeze ball;10 reps;Left Other Exercises Other Exercises: Provided HEP for improved fine motor coordination including in hand manipulation (palm>finger and finger>palm translation), thumb circles, composite grasp and extension, pincer grasp activities, and digit lifts.  Other Exercises: Facilitated improved coordination and accuracy with functional reaching tasks in standing position.   Shoulder Instructions      Home Living Family/patient expects to be discharged to:: Private residence Living Arrangements: Spouse/significant other Available Help at Discharge: Family Type of Home: House Home Access: Stairs to enter [employer] of Steps: 5 Entrance Stairs-Rails: Right;Left;Can reach both Home Layout: Two level;Able to live on main level with bedroom/bathroom     Bathroom Shower/Tub: Tub/shower unit;Walk-in shower   Bathroom Toilet: Standard     Home Equipment: None          Prior  Functioning/Environment Level of Independence: Independent        Comments: Working full time        OT Problem List: Decreased strength;Decreased activity tolerance;Impaired balance (sitting and/or standing);Decreased safety awareness;Decreased knowledge of use of DME or AE;Decreased knowledge of precautions;Pain;Impaired UE functional use      OT Treatment/Interventions: Self-care/ADL training;Therapeutic exercise;Energy conservation;DME and/or AE instruction;Neuromuscular education;Therapeutic activities;Patient/family education;Balance training    OT Goals(Current goals can be found in the care plan section) Acute Rehab OT Goals Patient Stated Goal: return home and to independence OT Goal Formulation: With patient/family Time For Goal Achievement: 12/21/16 Potential to Achieve Goals: Good ADL Goals Pt/caregiver will Perform Home Exercise Program: Increased strength;Left upper extremity;With theraband;With written HEP provided;Independently Additional ADL Goal #1: Pt will demonstrate improved fine motor coordination to complete a variety of fasteners during dressing tasks. Additional ADL Goal #2: Pt will incorporate 2 strategies to compensate for sensation deficits in L UE.  OT Frequency: Min 2X/week   Barriers to D/C:            Co-evaluation              AM-PAC PT "6 Clicks" Daily Activity     Outcome Measure Help from another person eating meals?: None Help from another person taking care of personal grooming?: A Little Help from another person toileting, which includes using toliet, bedpan, or urinal?: A Little Help from another person bathing (including washing, rinsing, drying)?: A Little Help from another person to put  on and taking off regular upper body clothing?: A Little Help from another person to put on and taking off regular lower body clothing?: A Little 6 Click Score: 19   End of Session Equipment Utilized During Treatment: Gait belt Nurse  Communication: Mobility status  Activity Tolerance: Patient tolerated treatment well Patient left: in bed;with call bell/phone within reach;with family/visitor present  OT Visit Diagnosis: Hemiplegia and hemiparesis;Unsteadiness on feet (R26.81) Hemiplegia - Right/Left: Left Hemiplegia - dominant/non-dominant: Non-Dominant Hemiplegia - caused by: Unspecified                Time: 1025-1057 OT Time Calculation (min): 32 min Charges:  OT General Charges $OT Visit: 1 Procedure OT Evaluation $OT Eval Moderate Complexity: 1 Procedure OT Treatments $Therapeutic Exercise: 8-22 mins G-Codes: OT G-codes **NOT FOR INPATIENT CLASS** Functional Assessment Tool Used: Clinical judgement Functional Limitation: Self care Self Care Current Status (Z6109): At least 1 percent but less than 20 percent impaired, limited or restricted Self Care Goal Status (U0454): 0 percent impaired, limited or restricted   Doristine Section, MS OTR/L  Pager: [phone]   Meher Kucinski A Marra Fraga 12/07/2016, 2:16 PM

## 2016-12-07 NOTE — Evaluation (Signed)
Physical Therapy Evaluation Patient Details Name: Cody Guerrero MRN: [id number] DOB: Apr 05, 1976 Today's Date: 12/07/2016   History of Present Illness     Clinical Impression  Pt presented supine in bed with HOB elevated, awake and willing to participate in therapy session. Prior to admission, pt reported that he was independent with all functional mobility, ADLs and continues to work full-time. Pt's wife was present throughout session as well. Pt ambulated in hallway and participate in a higher level balance assessment (DGI = 22/24, indicating he is a safe community ambulator). Pt also successfully ascended and descended two steps without any instability or LOB. No further acute PT needs at this time. PT signing off.     Follow Up Recommendations Outpatient PT    Equipment Recommendations  None recommended by PT    Recommendations for Other Services       Precautions / Restrictions Precautions Precautions: None Restrictions Weight Bearing Restrictions: No      Mobility  Bed Mobility Overal bed mobility: Independent                Transfers Overall transfer level: Modified independent                  Ambulation/Gait Ambulation/Gait assistance: Supervision;Min guard Ambulation Distance (Feet): 300 Feet Assistive device: None Gait Pattern/deviations: Step-through pattern;Decreased stride length Gait velocity: WFL Gait velocity interpretation: at or above normal speed for age/gender General Gait Details: pt with increased lateral sway with ambulation, mild instability but no need for physical assistance and no LOB. pt tolerated participating in a higher level balance assessment (DGI) with no issues.  Stairs Stairs: Yes Stairs assistance: Min guard Stair Management: No rails;Alternating pattern;Forwards Number of Stairs: 2    Wheelchair Mobility    Modified Rankin (Stroke Patients Only) Modified Rankin (Stroke Patients Only) Pre-Morbid Rankin Score: No  symptoms Modified Rankin: No significant disability     Balance Overall balance assessment: Needs assistance Sitting-balance support: Feet supported Sitting balance-Leahy Scale: Normal     Standing balance support: During functional activity;No upper extremity supported Standing balance-Leahy Scale: Good                   Standardized Balance Assessment Standardized Balance Assessment : Dynamic Gait Index   Dynamic Gait Index Level Surface: Mild Impairment Change in Gait Speed: Normal Gait with Horizontal Head Turns: Mild Impairment Gait with Vertical Head Turns: Normal Gait and Pivot Turn: Normal Step Over Obstacle: Normal Step Around Obstacles: Normal Steps: Normal Total Score: 22       Pertinent Vitals/Pain Pain Assessment: No/denies pain    Home Living Family/patient expects to be discharged to:: Private residence Living Arrangements: Spouse/significant other Available Help at Discharge: Family Type of Home: House Home Access: Stairs to enter Entrance Stairs-Rails: Right;Left;Can reach both Entrance Stairs-Number of Steps: 5 Home Layout: Two level;Able to live on main level with bedroom/bathroom Home Equipment: None      Prior Function Level of Independence: Independent         Comments: Working full time     Hand Dominance   Dominant Hand: Right    Extremity/Trunk Assessment   Upper Extremity Assessment Upper Extremity Assessment: Defer to OT evaluation    Lower Extremity Assessment Lower Extremity Assessment: Overall WFL for tasks assessed    Cervical / Trunk Assessment Cervical / Trunk Assessment: Normal  Communication   Communication: No difficulties  Cognition Arousal/Alertness: Awake/alert Behavior During Therapy: WFL for tasks assessed/performed Overall Cognitive Status: Within Functional Limits for  tasks assessed                                        General Comments      Exercises      Assessment/Plan    PT Assessment Patent does not need any further PT services;All further PT needs can be met in the next venue of care  PT Problem List Decreased coordination;Decreased balance       PT Treatment Interventions      PT Goals (Current goals can be found in the Care Plan section)  Acute Rehab PT Goals Patient Stated Goal: return home and to independence    Frequency     Barriers to discharge        Co-evaluation               AM-PAC PT "6 Clicks" Daily Activity  Outcome Measure Difficulty turning over in bed (including adjusting bedclothes, sheets and blankets)?: None Difficulty moving from lying on back to sitting on the side of the bed? : None Difficulty sitting down on and standing up from a chair with arms (e.g., wheelchair, bedside commode, etc,.)?: None Help needed moving to and from a bed to chair (including a wheelchair)?: None Help needed walking in hospital room?: None Help needed climbing 3-5 steps with a railing? : None 6 Click Score: 24    End of Session Equipment Utilized During Treatment: Gait belt Activity Tolerance: Patient tolerated treatment well Patient left: in bed;with call bell/phone within reach;with family/visitor present Nurse Communication: Mobility status PT Visit Diagnosis: Other symptoms and signs involving the nervous system (R29.898)    Time: 8768-1157 PT Time Calculation (min) (ACUTE ONLY): 16 min   Charges:   PT Evaluation $PT Eval Moderate Complexity: 1 Procedure     PT G Codes:   PT G-Codes **NOT FOR INPATIENT CLASS** Functional Assessment Tool Used: AM-PAC 6 Clicks Basic Mobility;Clinical judgement Functional Limitation: Mobility: Walking and moving around Mobility: Walking and Moving Around Current Status (W6203): 0 percent impaired, limited or restricted Mobility: Walking and Moving Around Goal Status (T5974): 0 percent impaired, limited or restricted Mobility: Walking and Moving Around Discharge Status  (B6384): 0 percent impaired, limited or restricted    [facility], PT, DPT Rew 12/07/2016, 12:05 PM

## 2016-12-07 NOTE — Progress Notes (Signed)
PROGRESS NOTE                                                                                                                                                                                                             Patient Demographics:    Cody Guerrero, is a 41 y.o. male, DOB - Nov 16, 1975, CZY:[id number]  Admit date - 12/06/2016   Admitting Physician Haydee Salter, MD  Outpatient Primary MD for the patient is Juanita Laster, MD  LOS - 0  Outpatient Specialists: Neurology Dr Hyacinth Meeker in [place].  Chief Complaint  Patient presents with  . Dizziness       Brief Narrative   41 y.o. male with a history of previous stroke presents with dizziness and left-sided ataxia , Admitted for CVA workup.   Subjective:    Cody Guerrero today Still reports some residual left-sided weakness, and decreased sensation, denies any chest pain, shortness of breath, fever or any new deficits .    Assessment  & Plan :    Active Problems:   Stroke-like episode (HCC)   Stroke like episodes - Known history of stroke, presents with some left-sided weakness, MRI brain  still pending, CTA neck with no significant stenosis, CTA head with right P3 occlusion, LDL is acceptable at 67 on statin, he is on full dose aspirin at home loaded with 300 mg of Plavix yesterday, currently on Plavix 75 mg daily, 2-D echo pending, maria pending.  Hyperlipidemia - LDL is 67, continue with statin  History of stroke - Continue with  statin, aspirin has been changed to Lovenox   Code Status : Full  Family Communication  : wife at bedside  Disposition Plan  : Home when stable  Consults  :  neuro  Procedures  : none  DVT Prophylaxis  :   Heparin - SCDs   Lab Results  Component Value Date   PLT 174 12/06/2016    Antibiotics  :    Anti-infectives    None        Objective:   Vitals:   12/06/16 2300 12/07/16 0100 12/07/16 0300 12/07/16 0858  BP: 118/71 114/69 122/65  122/72  Pulse: 88 90 81 80  Resp:  18  18  Temp:    98 F (36.7 C)  TempSrc:      SpO2: 96%  95% 96% 97%  Weight:      Height:        Wt Readings from Last 3 Encounters:  12/06/16 113.9 kg (251 lb)  07/11/13 115.2 kg (254 lb)    No intake or output data in the 24 hours ending 12/07/16 1242   Physical Exam  Awake Alert, Oriented X 3,  Symmetrical Chest wall movement, Good air movement bilaterally, CTAB RRR,No Gallops,Rubs or new Murmurs, No Parasternal Heave +ve B.Sounds, Abd Soft, No tenderness,  No rebound - guarding or rigidity. No Cyanosis, Clubbing or edema, No new Rash or bruise , does have mild left left-sided weakness without drift.    Data Review:    CBC  Recent Labs Lab 12/06/16 1140 12/06/16 1807  WBC 6.7 15.8*  HGB 15.4 15.3  HCT 44.8 45.7  PLT 177 174  MCV 84.7 85.4  MCH 29.1 28.6  MCHC 34.4 33.5  RDW 13.5 13.3  LYMPHSABS 1.7  --   MONOABS 0.6  --   EOSABS 0.3  --   BASOSABS 0.0  --     Chemistries   Recent Labs Lab 12/06/16 1140 12/06/16 1807  NA 140  --   K 3.6  --   CL 105  --   CO2 26  --   GLUCOSE 108*  --   BUN 14  --   CREATININE 0.96 0.88  CALCIUM 8.8*  --   AST 32  --   ALT 42  --   ALKPHOS 78  --   BILITOT 0.8  --    ------------------------------------------------------------------------------------------------------------------  Recent Labs  12/07/16 0347  CHOL 130  HDL 43  LDLCALC 67  TRIG 98  CHOLHDL 3.0    No results found for: HGBA1C ------------------------------------------------------------------------------------------------------------------ No results for input(s): TSH, T4TOTAL, T3FREE, THYROIDAB in the last 72 hours.  Invalid input(s): FREET3 ------------------------------------------------------------------------------------------------------------------ No results for input(s): VITAMINB12, FOLATE, FERRITIN, TIBC, IRON, RETICCTPCT in the last 72 hours.  Coagulation profile  Recent Labs Lab  12/06/16 1140  INR 0.95    No results for input(s): DDIMER in the last 72 hours.  Cardiac Enzymes  Recent Labs Lab 12/06/16 1140  TROPONINI <0.03   ------------------------------------------------------------------------------------------------------------------ No results found for: BNP  Inpatient Medications  Scheduled Meds: .  stroke: mapping our early stages of recovery book   Does not apply Once  . atorvastatin  40 mg Oral q1800  . clopidogrel  300 mg Oral Once  . clopidogrel  75 mg Oral Daily  . heparin  5,000 Units Subcutaneous Q8H   Continuous Infusions: PRN Meds:.acetaminophen **OR** acetaminophen (TYLENOL) oral liquid 160 mg/5 mL **OR** acetaminophen, hydrALAZINE, LORazepam, meclizine, ondansetron (ZOFRAN) IV, prochlorperazine, senna-docusate  Micro Results No results found for this or any previous visit (from the past 240 hour(s)).  Radiology Reports Ct Angio Head W Or Wo Contrast  Result Date: 12/06/2016 CLINICAL DATA:  Stroke, status post tPA. Dizziness, weakness of LEFT extremities, decreased LEFT sensation. History of stroke. History of hyperlipidemia and stroke like symptoms. EXAM: CT ANGIOGRAPHY HEAD AND NECK TECHNIQUE: Multidetector CT imaging of the head and neck was performed using the standard protocol during bolus administration of intravenous contrast. Multiplanar CT image reconstructions and MIPs were obtained to evaluate the vascular anatomy. Carotid stenosis measurements (when applicable) are obtained utilizing NASCET criteria, using the distal internal carotid diameter as the denominator. CONTRAST:  50 cc Isovue 370 COMPARISON:  CT HEAD Dec 06, 2016 at 1206 hours ; and MRI/MRA head July 12, 2013 FINDINGS: CTA NECK AORTIC ARCH:  Normal appearance of the thoracic arch, normal branch pattern. The origins of the innominate, left Common carotid artery and subclavian artery are widely patent. RIGHT CAROTID SYSTEM: Common carotid artery is widely patent.  Normal appearance of the carotid bifurcation without hemodynamically significant stenosis by NASCET criteria. Normal appearance of the included internal carotid artery. LEFT CAROTID SYSTEM: Common carotid artery is widely patent. Normal appearance of the carotid bifurcation without hemodynamically significant stenosis by NASCET criteria. Normal appearance of the included internal carotid artery. VERTEBRAL ARTERIES:RIGHT vertebral artery is dominant. Normal appearance of the vertebral arteries, which appear widely patent. SKELETON: No acute osseous process though bone windows have not been submitted. Degenerative cervical spine resulting in moderate canal stenosis C6-7, mild at C5-6. Absent maxillary teeth. OTHER NECK: Soft tissues of the neck are non-acute though, not tailored for evaluation. CTA HEAD ANTERIOR CIRCULATION: Patent cervical internal carotid arteries, petrous, cavernous and supra clinoid internal carotid arteries. Widely patent anterior communicating artery. Patent anterior and middle cerebral arteries. No large vessel occlusion, hemodynamically significant stenosis, dissection, luminal irregularity, contrast extravasation or aneurysm. POSTERIOR CIRCULATION: Patent vertebral arteries, vertebrobasilar junction and basilar artery, as well as main branch vessels. Patent posterior cerebral arteries. Fetal origin RIGHT posterior cerebral artery. Mild luminal regularity proximal bilateral cerebral artery's. Moderate tandem stenosis RIGHT P3 segment, the inferior RIGHT P3 segment is occluded. No large vessel occlusion, dissection, contrast extravasation or aneurysm. VENOUS SINUSES: Major dural venous sinuses are patent though not tailored for evaluation on this angiographic examination. ANATOMIC VARIANTS: None. DELAYED PHASE: No abnormal intracranial enhancement. MIP images reviewed. IMPRESSION: CTA neck:  Negative. Moderate C6-7 canal stenosis. CTA HEAD: RIGHT P3 occlusion, potentially acute given patient's  symptoms. Mild intracranial atherosclerosis, moderate RIGHT PCA involvement. Electronically Signed   By: Awilda Metroourtnay  Bloomer M.D.   On: 12/06/2016 23:34   Ct Head Wo Contrast  Result Date: 12/06/2016 CLINICAL DATA:  Pt started having dizziness started 1035. Pt states he felt weakness. Noticed struggling keeping up left sided extremities. Pt has decreased sensation on left. Hx of stroke 3.5 years ago, hyperlipidemia EXAM: CT HEAD WITHOUT CONTRAST TECHNIQUE: Contiguous axial images were obtained from the base of the skull through the vertex without intravenous contrast. COMPARISON:  07/11/2013 FINDINGS: Brain: No evidence of acute infarction, hemorrhage, hydrocephalus, extra-axial collection or mass lesion/mass effect. Vascular: No hyperdense vessel or unexpected calcification. Skull: Normal. Negative for fracture or focal lesion. Sinuses/Orbits: Patchy mucosal disease in the sphenoid sinuses. Otherwise negative. Other: None. IMPRESSION: 1. Negative for bleed or other acute intracranial process. 2. Ethmoid sinus disease. Electronically Signed   By: Corlis Leak  Hassell M.D.   On: 12/06/2016 12:17   Ct Angio Neck W Or Wo Contrast  Result Date: 12/06/2016 CLINICAL DATA:  Stroke, status post tPA. Dizziness, weakness of LEFT extremities, decreased LEFT sensation. History of stroke. History of hyperlipidemia and stroke like symptoms. EXAM: CT ANGIOGRAPHY HEAD AND NECK TECHNIQUE: Multidetector CT imaging of the head and neck was performed using the standard protocol during bolus administration of intravenous contrast. Multiplanar CT image reconstructions and MIPs were obtained to evaluate the vascular anatomy. Carotid stenosis measurements (when applicable) are obtained utilizing NASCET criteria, using the distal internal carotid diameter as the denominator. CONTRAST:  50 cc Isovue 370 COMPARISON:  CT HEAD Dec 06, 2016 at 1206 hours ; and MRI/MRA head July 12, 2013 FINDINGS: CTA NECK AORTIC ARCH: Normal appearance of the  thoracic arch, normal branch pattern. The origins of the innominate, left Common carotid artery and subclavian artery are widely patent. RIGHT  CAROTID SYSTEM: Common carotid artery is widely patent. Normal appearance of the carotid bifurcation without hemodynamically significant stenosis by NASCET criteria. Normal appearance of the included internal carotid artery. LEFT CAROTID SYSTEM: Common carotid artery is widely patent. Normal appearance of the carotid bifurcation without hemodynamically significant stenosis by NASCET criteria. Normal appearance of the included internal carotid artery. VERTEBRAL ARTERIES:RIGHT vertebral artery is dominant. Normal appearance of the vertebral arteries, which appear widely patent. SKELETON: No acute osseous process though bone windows have not been submitted. Degenerative cervical spine resulting in moderate canal stenosis C6-7, mild at C5-6. Absent maxillary teeth. OTHER NECK: Soft tissues of the neck are non-acute though, not tailored for evaluation. CTA HEAD ANTERIOR CIRCULATION: Patent cervical internal carotid arteries, petrous, cavernous and supra clinoid internal carotid arteries. Widely patent anterior communicating artery. Patent anterior and middle cerebral arteries. No large vessel occlusion, hemodynamically significant stenosis, dissection, luminal irregularity, contrast extravasation or aneurysm. POSTERIOR CIRCULATION: Patent vertebral arteries, vertebrobasilar junction and basilar artery, as well as main branch vessels. Patent posterior cerebral arteries. Fetal origin RIGHT posterior cerebral artery. Mild luminal regularity proximal bilateral cerebral artery's. Moderate tandem stenosis RIGHT P3 segment, the inferior RIGHT P3 segment is occluded. No large vessel occlusion, dissection, contrast extravasation or aneurysm. VENOUS SINUSES: Major dural venous sinuses are patent though not tailored for evaluation on this angiographic examination. ANATOMIC VARIANTS: None.  DELAYED PHASE: No abnormal intracranial enhancement. MIP images reviewed. IMPRESSION: CTA neck:  Negative. Moderate C6-7 canal stenosis. CTA HEAD: RIGHT P3 occlusion, potentially acute given patient's symptoms. Mild intracranial atherosclerosis, moderate RIGHT PCA involvement. Electronically Signed   By: Awilda Metro M.D.   On: 12/06/2016 23:34    Lux Meaders M.D on 12/07/2016 at 12:42 PM  Between 7am to 7pm - Pager - [phone]  After 7pm go to [email or web address] - password [email or web address]  [facility] -  Office  [phone]

## 2016-12-07 NOTE — Progress Notes (Signed)
STROKE TEAM PROGRESS NOTE   HISTORY OF PRESENT ILLNESS (per record) Cody BrackettJamie Guerrero is a 41 y.o. male with a history of previous stroke presents with dizziness and left-sided ataxia that started around 10:30 AM. He presented a [place] [facility], and at that time had mild symptoms, he was evaluated  And found to have an NIH stroke scale of 1. I discussed with the treating physician at that time, and given that he did not have a disabling deficit the decision to not pursue TPA was made. He states that his dizziness got significantly worse around 3 PM, which [facility]. Of note, when he was evaluated at [place], he received 1 point for sensation, but his sensory symptoms have since resolved.  LKW: 10:30 AM tpa given?: no, mild symptoms Premorbid modified rankin scale: 0   SUBJECTIVE (INTERVAL HISTORY) His wife is at bedside. Patient feels significantly improved. 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 He had elevated LDL. Now right p3 occlusion. Discussed embolic causes with patient and wife and further imaging.   OBJECTIVE Temp:  [97.6 F (36.4 C)-98.1 F (36.7 C)] 98.1 F (36.7 C) (05/25 1910) Pulse Rate:  [62-90] 81 (05/26 0300) Cardiac Rhythm: Normal sinus rhythm (05/25 1940) Resp:  [10-20] 18 (05/26 0100) BP: (114-134)/(65-113) 122/65 (05/26 0300) SpO2:  [95 %-100 %] 96 % (05/26 0300) Weight:  [113.9 kg (251 lb)] 113.9 kg (251 lb) (05/25 1510)  CBC:  Recent Labs Lab 12/06/16 1140 12/06/16 1807  WBC 6.7 15.8*  NEUTROABS 4.1  --   HGB 15.4 15.3  HCT 44.8 45.7  MCV 84.7 85.4  PLT 177 174    Basic Metabolic Panel:  Recent Labs Lab 12/06/16 1140 12/06/16 1807  NA 140  --   K 3.6  --   CL 105  --   CO2 26  --   GLUCOSE 108*  --   BUN 14  --   CREATININE 0.96 0.88  CALCIUM 8.8*  --     Lipid Panel:    Component Value Date/Time   CHOL 130  12/07/2016 0347   TRIG 98 12/07/2016 0347   HDL 43 12/07/2016 0347   CHOLHDL 3.0 12/07/2016 0347   VLDL 20 12/07/2016 0347   LDLCALC 67 12/07/2016 0347   HgbA1c: No results found for: HGBA1C Urine Drug Screen:    Component Value Date/Time   LABOPIA NONE DETECTED 12/06/2016 1314   COCAINSCRNUR NONE DETECTED 12/06/2016 1314   LABBENZ NONE DETECTED 12/06/2016 1314   AMPHETMU NONE DETECTED 12/06/2016 1314   THCU NONE DETECTED 12/06/2016 1314   LABBARB NONE DETECTED 12/06/2016 1314    Alcohol Level     Component Value Date/Time   ETH <5 12/06/2016 1140    IMAGING  Ct Angio Head W Or Wo Contrast Ct Angio Neck W Or Wo Contrast 12/06/2016  CTA neck:   Negative. Moderate C6-7 canal stenosis.   CTA HEAD:  RIGHT P3 occlusion, potentially acute given patient's symptoms. Mild intracranial atherosclerosis, moderate RIGHT PCA involvement.    Ct Head Wo Contrast 12/06/2016 1. Negative for bleed or other acute intracranial process.  2. Ethmoid sinus disease.     MR Brain - pending     Physical exam: Exam: Gen: NAD, conversant, well nourised, well groomed  CV: RRR, no MRG. No Carotid Bruits. No peripheral edema, warm, nontender Eyes: Conjunctivae clear without exudates or hemorrhage  Neuro: Detailed Neurologic Exam  Speech:    Speech is normal; fluent and spontaneous with normal comprehension.  Cognition:    The patient is oriented to person, place, and time;     recent and remote memory intact;     language fluent;     normal attention, concentration, fund of knowledge Cranial Nerves:    The pupils are equal, round, and reactive to light. Visual fields are full to finger confrontation. Extraocular movements are intact. Trigeminal sensation is intact and the muscles of mastication are normal. The face is symmetric. The palate elevates in the midline. Hearing intact. Voice is normal. Shoulder shrug is normal. The tongue has normal motion without  fasciculations.   Coordination:    Normal finger to nose and heel to shin.  Motor Observation:    No asymmetry, no atrophy, and no involuntary movements noted. Tone:    Normal muscle tone.    Posture:    Posture is normal. normal erect    Strength:    Strength is V/V in the upper and lower limbs.      Sensation: intact to LT     Reflex Exam:  DTR's:    Deep tendon reflexes in the upper and lower extremities are normal bilaterally.   Toes:    The toes are equivocal bilaterally.   Clonus:    Clonus is absent.      ASSESSMENT/PLAN [name] is a 41 y.o. male with history of previous stroke and hyperlipidemia, presenting with dizziness and left sided ataxia. He did not receive IV t-PA due to mild deficits.  Stroke:  Possibly embolic.  Resultant  Symptoms resolved  CT head - essentially negative  MRI head -  pending  MRA head -  pending  CTA H&N - RIGHT P3 occlusion, potentially acute.  Moderate RIGHT PCA involvement.  Carotid Doppler - CTA neck  2D Echo -  pending  Will need TEE and loop  LDL - 67  HgbA1c -  pending  VTE prophylaxis - subcutaneous heparin  Diet Heart Room service appropriate? Yes; Fluid consistency: Thin  aspirin 325 mg daily prior to admission, now on clopidogrel 75 mg daily  Patient counseled to be compliant with his antithrombotic medications  Ongoing aggressive stroke risk factor management  Therapy recommendations:  Outpatient OT and PT recommended.  Disposition:  Pending  Hypertension  Stable - occasional mild low blood pressures  Permissive hypertension (OK if < 220/120) but gradually normalize in 5-7 days  Long-term BP goal normotensive  Hyperlipidemia  Home meds:  Lipitor 40 mg daily resumed in hospital  LDL 67, goal < 70  Continue statin at discharge    Other Stroke Risk Factors  Obesity, Body mass index is 33.12 kg/m., recommend weight loss, diet and exercise as appropriate   Hx  stroke/TIA    Other Active Problems  Leukocytosis - 15.8 (afebrile) - recheck in a.m.  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 He had elevated LDL. Now right p3 occlusion. Discussed embolic causes with patient and wife and further workup TEE and loop. Will consult Dr. Roda Shutters on this case.  Personally examined patient and images, and have participated in and made any corrections needed  to history, physical, neuro exam,assessment and plan as stated above.  I have personally obtained the history, evaluated lab date, reviewed imaging studies and agree with radiology interpretations.    Naomie Dean, MD Stroke Neurology   To contact Stroke Continuity provider, please refer to [email or web address]. After hours, contact General Neurology

## 2016-12-07 NOTE — Progress Notes (Signed)
  Echocardiogram 2D Echocardiogram has been performed.  Cody Guerrero, Cody Guerrero 12/07/2016, 7:55 PM

## 2016-12-08 DIAGNOSIS — G459 Transient cerebral ischemic attack, unspecified: Secondary | ICD-10-CM | POA: Diagnosis not present

## 2016-12-08 DIAGNOSIS — I639 Cerebral infarction, unspecified: Secondary | ICD-10-CM | POA: Diagnosis not present

## 2016-12-08 LAB — CBC
HEMATOCRIT: 45.5 % (ref 39.0–52.0)
Hemoglobin: 15 g/dL (ref 13.0–17.0)
MCH: 28.5 pg (ref 26.0–34.0)
MCHC: 33 g/dL (ref 30.0–36.0)
MCV: 86.5 fL (ref 78.0–100.0)
PLATELETS: 168 10*3/uL (ref 150–400)
RBC: 5.26 MIL/uL (ref 4.22–5.81)
RDW: 13.7 % (ref 11.5–15.5)
WBC: 8.1 10*3/uL (ref 4.0–10.5)

## 2016-12-08 LAB — HEMOGLOBIN A1C
Hgb A1c MFr Bld: 5.2 % (ref 4.8–5.6)
Mean Plasma Glucose: 103 mg/dL

## 2016-12-08 LAB — ECHOCARDIOGRAM COMPLETE
Height: 73 in
Weight: 4016 oz

## 2016-12-08 NOTE — Discharge Summary (Signed)
Cody Guerrero, is a 41 y.o. male  DOB November 03, 1975  MRN [id number].  Admission date:  12/06/2016  Admitting Physician  Haydee Salter, MD  Discharge Date:  12/08/2016   Primary MD  Juanita Laster, MD  Recommendations for primary care physician for things to follow:  - Please check CBC, BMP during next visit, patient to keep his follow-up appointment with neurology Dr. Roda Shutters this Wednesday 12/11/2016   Admission Diagnosis  Transient cerebral ischemia, unspecified type [G45.9]   Discharge Diagnosis  Transient cerebral ischemia, unspecified type [G45.9]    Active Problems:   Stroke-like episode (HCC)   Cerebellar infarct [facility])      Past Medical History:  Diagnosis Date  . Family history of adverse reaction to anesthesia    MOTHER  . Hyperlipemia   . Stroke [facility])     Past Surgical History:  Procedure Laterality Date  . HERNIA REPAIR  08/2015       History of present illness and  Hospital Course:     Kindly see H&P for history of present illness and admission details, please review complete Labs, Consult reports and Test reports for all details in brief  HPI  from the history and physical done on the day of admission 12/06/2016 HPI: Cody Guerrero is a 41 y.o. male  with past medical history of stroke, hyperlipidemia, vertigo and migraines presents with complaint of nausea vomiting and dizziness. Patient states that he was standing at about 10:30 AM the morning of admission had sudden onset of dizziness with mild headache. Patient denies having any symptoms prior to this. Last night was normal. No recent illness. No sick contacts. No head trauma. Headache and dizziness were followed by some nausea. Patient then noticed that when doing no activities had differently with his left arm which began to feel heavy and numb. Patient did take 324 mg of aspirin today. Made his way to the emergency room for  evaluation.  Denies fever, chills, chest pain, sob, diarrhea.   Hospital Course   TIA - Known history of stroke, presents with some left-sided weakness, MRI brain with no evidence of acute CVA, but will chronic infarct in left superior cerebellum, CTA neck with no significant stenosis, CTA head with right P3 occlusion(which appears to be old when compared to previous imaging when discussed with neurology), LDL is acceptable at 67 on statin, he is on full dose aspirin at home loaded with 300 mg of Plavix and was kept on Plavix 75 mg oral daily during hospital stay, 2-D echo with no evidence of embolic source, no regional wall motion abnormality, and preserved EF. - Discussed within urology, plan to discharge on full dose aspirin, and he will be seen by neurology Dr. Roda Shutters this Wednesday to see if there is any need for loop recorder or TEE.  Hyperlipidemia - LDL is 67, continue with statin  History of stroke - Continue with  statin, aspirin     Discharge Condition:    Follow UP  Follow-up Information  Marvel Plan, MD Follow up.   Specialty:  Neurology Why:  Appointment scheduled for this Wednesday, 12/11/2016, at 8 AM, please be there at 7:30 AM Contact information: [address] [place] [address] [phone]             Discharge Instructions  and  Discharge Medications     Discharge Instructions    Discharge instructions    Complete by:  As directed    Follow with Primary MD Juanita Laster, MD in 7 days  - Have your PCP check PCP, BMP during next visit   Activity: As tolerated    Disposition Home    Diet: Heart Healthy    On your next visit with your primary care physician please Get Medicines reviewed and adjusted.   Please request your Prim.MD to go over all Hospital Tests and Procedure/Radiological results at the follow up, please get all Hospital records sent to your Prim MD by signing hospital release before you go home.   If  you experience worsening of your admission symptoms, develop shortness of breath, life threatening emergency, suicidal or homicidal thoughts you must seek medical attention immediately by calling 911 or calling your MD immediately  if symptoms less severe.  You Must read complete instructions/literature along with all the possible adverse reactions/side effects for all the Medicines you take and that have been prescribed to you. Take any new Medicines after you have completely understood and accpet all the possible adverse reactions/side effects.   Do not drive, operating heavy machinery, perform activities at heights, swimming or participation in water activities or provide baby sitting services if your were admitted for syncope or siezures until you have seen by Primary MD or a Neurologist and advised to do so again.  Do not drive when taking Pain medications.    Do not take more than prescribed Pain, Sleep and Anxiety Medications  Special Instructions: If you have smoked or chewed Tobacco  in the last 2 yrs please stop smoking, stop any regular Alcohol  and or any Recreational drug use.  Wear Seat belts while driving.   Please note  You were cared for by a hospitalist during your hospital stay. If you have any questions about your discharge medications or the care you received while you were in the hospital after you are discharged, you can call the unit and asked to speak with the hospitalist on call if the hospitalist that took care of you is not available. Once you are discharged, your primary care physician will handle any further medical issues. Please note that NO REFILLS for any discharge medications will be authorized once you are discharged, as it is imperative that you return to your primary care physician (or establish a relationship with a primary care physician if you do not have one) for your aftercare needs so that they can reassess your need for medications and monitor your lab  values.   Increase activity slowly    Complete by:  As directed      Allergies as of 12/08/2016   No Known Allergies     Medication List    TAKE these medications   aspirin 325 MG EC tablet Take 325 mg by mouth daily.   atorvastatin 40 MG tablet Commonly known as:  LIPITOR Take 40 mg by mouth daily.   b complex vitamins tablet Take 1 tablet by mouth daily.   Fish Oil 1000 MG Caps Take 1 capsule by mouth daily.   meclizine  25 MG tablet Commonly known as:  ANTIVERT Take 50 mg by mouth 3 (three) times daily as needed for dizziness.         Diet and Activity recommendation: See Discharge Instructions above   Consults obtained - Neurology   Major procedures and Radiology Reports - PLEASE review detailed and final reports for all details, in brief -      Ct Angio Head W Or Wo Contrast  Result Date: 12/06/2016 CLINICAL DATA:  Stroke, status post tPA. Dizziness, weakness of LEFT extremities, decreased LEFT sensation. History of stroke. History of hyperlipidemia and stroke like symptoms. EXAM: CT ANGIOGRAPHY HEAD AND NECK TECHNIQUE: Multidetector CT imaging of the head and neck was performed using the standard protocol during bolus administration of intravenous contrast. Multiplanar CT image reconstructions and MIPs were obtained to evaluate the vascular anatomy. Carotid stenosis measurements (when applicable) are obtained utilizing NASCET criteria, using the distal internal carotid diameter as the denominator. CONTRAST:  50 cc Isovue 370 COMPARISON:  CT HEAD Dec 06, 2016 at 1206 hours ; and MRI/MRA head July 12, 2013 FINDINGS: CTA NECK AORTIC ARCH: Normal appearance of the thoracic arch, normal branch pattern. The origins of the innominate, left Common carotid artery and subclavian artery are widely patent. RIGHT CAROTID SYSTEM: Common carotid artery is widely patent. Normal appearance of the carotid bifurcation without hemodynamically significant stenosis by NASCET  criteria. Normal appearance of the included internal carotid artery. LEFT CAROTID SYSTEM: Common carotid artery is widely patent. Normal appearance of the carotid bifurcation without hemodynamically significant stenosis by NASCET criteria. Normal appearance of the included internal carotid artery. VERTEBRAL ARTERIES:RIGHT vertebral artery is dominant. Normal appearance of the vertebral arteries, which appear widely patent. SKELETON: No acute osseous process though bone windows have not been submitted. Degenerative cervical spine resulting in moderate canal stenosis C6-7, mild at C5-6. Absent maxillary teeth. OTHER NECK: Soft tissues of the neck are non-acute though, not tailored for evaluation. CTA HEAD ANTERIOR CIRCULATION: Patent cervical internal carotid arteries, petrous, cavernous and supra clinoid internal carotid arteries. Widely patent anterior communicating artery. Patent anterior and middle cerebral arteries. No large vessel occlusion, hemodynamically significant stenosis, dissection, luminal irregularity, contrast extravasation or aneurysm. POSTERIOR CIRCULATION: Patent vertebral arteries, vertebrobasilar junction and basilar artery, as well as main branch vessels. Patent posterior cerebral arteries. Fetal origin RIGHT posterior cerebral artery. Mild luminal regularity proximal bilateral cerebral artery's. Moderate tandem stenosis RIGHT P3 segment, the inferior RIGHT P3 segment is occluded. No large vessel occlusion, dissection, contrast extravasation or aneurysm. VENOUS SINUSES: Major dural venous sinuses are patent though not tailored for evaluation on this angiographic examination. ANATOMIC VARIANTS: None. DELAYED PHASE: No abnormal intracranial enhancement. MIP images reviewed. IMPRESSION: CTA neck:  Negative. Moderate C6-7 canal stenosis. CTA HEAD: RIGHT P3 occlusion, potentially acute given patient's symptoms. Mild intracranial atherosclerosis, moderate RIGHT PCA involvement. Electronically Signed    By: Awilda Metroourtnay  Bloomer M.D.   On: 12/06/2016 23:34   Ct Head Wo Contrast  Result Date: 12/06/2016 CLINICAL DATA:  Pt started having dizziness started 1035. Pt states he felt weakness. Noticed struggling keeping up left sided extremities. Pt has decreased sensation on left. Hx of stroke 3.5 years ago, hyperlipidemia EXAM: CT HEAD WITHOUT CONTRAST TECHNIQUE: Contiguous axial images were obtained from the base of the skull through the vertex without intravenous contrast. COMPARISON:  07/11/2013 FINDINGS: Brain: No evidence of acute infarction, hemorrhage, hydrocephalus, extra-axial collection or mass lesion/mass effect. Vascular: No hyperdense vessel or unexpected calcification. Skull: Normal. Negative for fracture or focal lesion.  Sinuses/Orbits: Patchy mucosal disease in the sphenoid sinuses. Otherwise negative. Other: None. IMPRESSION: 1. Negative for bleed or other acute intracranial process. 2. Ethmoid sinus disease. Electronically Signed   By: Corlis Leak M.D.   On: 12/06/2016 12:17   Ct Angio Neck W Or Wo Contrast  Result Date: 12/06/2016 CLINICAL DATA:  Stroke, status post tPA. Dizziness, weakness of LEFT extremities, decreased LEFT sensation. History of stroke. History of hyperlipidemia and stroke like symptoms. EXAM: CT ANGIOGRAPHY HEAD AND NECK TECHNIQUE: Multidetector CT imaging of the head and neck was performed using the standard protocol during bolus administration of intravenous contrast. Multiplanar CT image reconstructions and MIPs were obtained to evaluate the vascular anatomy. Carotid stenosis measurements (when applicable) are obtained utilizing NASCET criteria, using the distal internal carotid diameter as the denominator. CONTRAST:  50 cc Isovue 370 COMPARISON:  CT HEAD Dec 06, 2016 at 1206 hours ; and MRI/MRA head July 12, 2013 FINDINGS: CTA NECK AORTIC ARCH: Normal appearance of the thoracic arch, normal branch pattern. The origins of the innominate, left Common carotid artery and  subclavian artery are widely patent. RIGHT CAROTID SYSTEM: Common carotid artery is widely patent. Normal appearance of the carotid bifurcation without hemodynamically significant stenosis by NASCET criteria. Normal appearance of the included internal carotid artery. LEFT CAROTID SYSTEM: Common carotid artery is widely patent. Normal appearance of the carotid bifurcation without hemodynamically significant stenosis by NASCET criteria. Normal appearance of the included internal carotid artery. VERTEBRAL ARTERIES:RIGHT vertebral artery is dominant. Normal appearance of the vertebral arteries, which appear widely patent. SKELETON: No acute osseous process though bone windows have not been submitted. Degenerative cervical spine resulting in moderate canal stenosis C6-7, mild at C5-6. Absent maxillary teeth. OTHER NECK: Soft tissues of the neck are non-acute though, not tailored for evaluation. CTA HEAD ANTERIOR CIRCULATION: Patent cervical internal carotid arteries, petrous, cavernous and supra clinoid internal carotid arteries. Widely patent anterior communicating artery. Patent anterior and middle cerebral arteries. No large vessel occlusion, hemodynamically significant stenosis, dissection, luminal irregularity, contrast extravasation or aneurysm. POSTERIOR CIRCULATION: Patent vertebral arteries, vertebrobasilar junction and basilar artery, as well as main branch vessels. Patent posterior cerebral arteries. Fetal origin RIGHT posterior cerebral artery. Mild luminal regularity proximal bilateral cerebral artery's. Moderate tandem stenosis RIGHT P3 segment, the inferior RIGHT P3 segment is occluded. No large vessel occlusion, dissection, contrast extravasation or aneurysm. VENOUS SINUSES: Major dural venous sinuses are patent though not tailored for evaluation on this angiographic examination. ANATOMIC VARIANTS: None. DELAYED PHASE: No abnormal intracranial enhancement. MIP images reviewed. IMPRESSION: CTA neck:   Negative. Moderate C6-7 canal stenosis. CTA HEAD: RIGHT P3 occlusion, potentially acute given patient's symptoms. Mild intracranial atherosclerosis, moderate RIGHT PCA involvement. Electronically Signed   By: Awilda Metro M.D.   On: 12/06/2016 23:34   Mr Brain Wo Contrast  Result Date: 12/07/2016 CLINICAL DATA:  History of stroke, hyperlipidemia, vertigo and migraines admitted with new onset of ataxia, concerning for acute posterior circulation infarct. EXAM: MRI HEAD WITHOUT CONTRAST TECHNIQUE: Multiplanar, multiecho pulse sequences of the brain and surrounding structures were obtained without intravenous contrast. COMPARISON:  CTA head neck 12/06/2016. CT head 12/06/2016. MRI brain with MRA of the extracranial and intracranial circulation 07/12/2013. FINDINGS: Brain: No evidence for acute infarction, hemorrhage, mass lesion, hydrocephalus, or extra-axial fluid. Normal for age cerebral volume. Mild subcortical white matter disease, nonspecific, likely early chronic microvascular ischemic change. There is a chronic LEFT superior cerebellar infarct which was acute in 2014. Cavum septum pellucidum and vergae. Vascular: Normal flow voids  in the carotid, basilar, and RIGHT vertebral artery. LEFT vertebral is small, but patent. Skull and upper cervical spine: Normal marrow signal. No tonsillar herniation or visible upper cervical compression. Sinuses/Orbits: No layering fluid. BILATERAL ethmoid sinus mucosal thickening. Negative orbits. Other: None. IMPRESSION: No acute posterior circulation abnormality is evident. There is a chronic infarct of the LEFT superior cerebellum. No proximal vascular occlusion. No acute intracranial findings. Mild subcortical white matter disease, likely chronic microvascular ischemic change. Electronically Signed   By: Elsie Stain M.D.   On: 12/07/2016 19:50    Micro Results    No results found for this or any previous visit (from the past 240 hour(s)).     Today    Subjective:   Cody Guerrero today has no headache,no chest abdominal pain,Reports weakness and numbness has resolved, feels much better wants to go home today.   Objective:   Blood pressure 117/75, pulse 60, temperature 97.7 F (36.5 C), temperature source Oral, resp. rate 18, height 6\' 1"  (1.854 m), weight 113.9 kg (251 lb), SpO2 98 %.  No intake or output data in the 24 hours ending 12/08/16 1147  Exam Awake Alert, Oriented x 3, No new F.N deficits, Normal affect Symmetrical Chest wall movement, Good air movement bilaterally, CTAB RRR,No Gallops,Rubs or new Murmurs, No Parasternal Heave +ve B.Sounds, Abd Soft, Non tender,No rebound -guarding or rigidity. No Cyanosis, Clubbing or edema, No new Rash or bruise  Data Review   CBC w Diff:  Lab Results  Component Value Date   WBC 8.1 12/08/2016   HGB 15.0 12/08/2016   HCT 45.5 12/08/2016   PLT 168 12/08/2016   LYMPHOPCT 25 12/06/2016   MONOPCT 10 12/06/2016   EOSPCT 4 12/06/2016   BASOPCT 0 12/06/2016    CMP:  Lab Results  Component Value Date   NA 140 12/06/2016   K 3.6 12/06/2016   CL 105 12/06/2016   CO2 26 12/06/2016   BUN 14 12/06/2016   CREATININE 0.88 12/06/2016   PROT 7.2 12/06/2016   ALBUMIN 4.3 12/06/2016   BILITOT 0.8 12/06/2016   ALKPHOS 78 12/06/2016   AST 32 12/06/2016   ALT 42 12/06/2016  .   Total Time in preparing paper work, data evaluation and todays exam - 35 minutes  Daric Koren M.D on 12/08/2016 at 11:47 AM  [facility]   Office  [phone]

## 2016-12-08 NOTE — Progress Notes (Signed)
STROKE TEAM PROGRESS NOTE   HISTORY OF PRESENT ILLNESS (per record) Cody Guerrero is a 41 y.o. male with a history of previous stroke presents with dizziness and left-sided ataxia that started around 10:30 AM. He presented a [facility], and at that time had mild symptoms, he was evaluated  And found to have an NIH stroke scale of 1. I discussed with the treating physician at that time, and given that he did not have a disabling deficit the decision to not pursue TPA was made. He states that his dizziness got significantly worse around 3 PM, which time [facility] is being [facility]. Of note, when he was evaluated [place], he received 1 point for sensation, but his sensory symptoms have since resolved.  LKW: 10:30 AM tpa given?: no, mild symptoms Premorbid modified rankin scale: 0   SUBJECTIVE (INTERVAL HISTORY) His wife is at bedside. Patient feels significantly improved. 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 He had elevated LDL. 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 Discussed embolic causes with patient and wife vs worsening of old cerebellar symptoms but suspect this was a TIA. Will keep him on ASA325mg  and have scheduled him for follow up with Dr. Roda ShuttersXu 12/11/2016 (3 days) at 8am outpatient.   OBJECTIVE Temp:  [97.7 F (36.5 C)-98.6 F (37 C)] 97.7 F (36.5 C) (05/27 0542) Pulse Rate:  [52-80] 55 (05/27 0542) Cardiac Rhythm: Normal sinus rhythm;Bundle branch block (05/26 2039) Resp:  [18-20] 18 (05/27 0542) BP: (101-124)/(56-76) 110/73 (05/27 0542) SpO2:  [96 %-97 %] 96 % (05/27 0542)  CBC:   Recent Labs Lab 12/06/16 1140 12/06/16 1807 12/08/16 0416  WBC 6.7 15.8* 8.1  NEUTROABS 4.1  --   --   HGB 15.4 15.3 15.0  HCT 44.8 45.7 45.5  MCV 84.7 85.4 86.5  PLT 177 174 168    Basic Metabolic Panel:   Recent Labs Lab 12/06/16 1140  12/06/16 1807  NA 140  --   K 3.6  --   CL 105  --   CO2 26  --   GLUCOSE 108*  --   BUN 14  --   CREATININE 0.96 0.88  CALCIUM 8.8*  --     Lipid Panel:     Component Value Date/Time   CHOL 130 12/07/2016 0347   TRIG 98 12/07/2016 0347   HDL 43 12/07/2016 0347   CHOLHDL 3.0 12/07/2016 0347   VLDL 20 12/07/2016 0347   LDLCALC 67 12/07/2016 0347   HgbA1c: No results found for: HGBA1C Urine Drug Screen:     Component Value Date/Time   LABOPIA NONE DETECTED 12/06/2016 1314   COCAINSCRNUR NONE DETECTED 12/06/2016 1314   LABBENZ NONE DETECTED 12/06/2016 1314   AMPHETMU NONE DETECTED 12/06/2016 1314   THCU NONE DETECTED 12/06/2016 1314   LABBARB NONE DETECTED 12/06/2016 1314    Alcohol Level     Component Value Date/Time   ETH <5 12/06/2016 1140    IMAGING  Ct Angio Head W Or Wo Contrast Ct Angio Neck W Or Wo Contrast 12/06/2016  CTA neck:   Negative. Moderate C6-7 canal stenosis.   CTA HEAD:  RIGHT P3 occlusion, potentially acute given patient's symptoms. Mild intracranial atherosclerosis, moderate RIGHT PCA involvement.    Ct Head Wo Contrast 12/06/2016 1. Negative for bleed or other acute intracranial process.  2. Ethmoid sinus disease.  MR Brain   No acute posterior circulation abnormality is evident. There is a chronic infarct of the LEFT superior cerebellum.  No proximal vascular occlusion.  No acute intracranial findings. Mild subcortical white matter disease, likely chronic microvascular ischemic change.   Physical exam: Exam: Gen: NAD, conversant, well nourised, well groomed                     CV: RRR, no MRG. No Carotid Bruits. No peripheral edema, warm, nontender Eyes: Conjunctivae clear without exudates or hemorrhage  Neuro: Detailed Neurologic Exam  Speech:    Speech is normal; fluent and spontaneous with normal comprehension.  Cognition:    The patient is oriented to person, place, and time;     recent and remote memory  intact;     language fluent;     normal attention, concentration, fund of knowledge Cranial Nerves:    The pupils are equal, round, and reactive to light. Visual fields are full to finger confrontation. Extraocular movements are intact. Trigeminal sensation is intact and the muscles of mastication are normal. The face is symmetric. The palate elevates in the midline. Hearing intact. Voice is normal. Shoulder shrug is normal. The tongue has normal motion without fasciculations.   Coordination:    Normal finger to nose and heel to shin.  Motor Observation:    No asymmetry, no atrophy, and no involuntary movements noted. Tone:    Normal muscle tone.    Posture:    Posture is normal. normal erect    Strength:    Strength is V/V in the upper and lower limbs.      Sensation: intact to LT     Reflex Exam:  DTR's:    Deep tendon reflexes in the upper and lower extremities are normal bilaterally.   Toes:    The toes are equivocal bilaterally.   Clonus:    Clonus is absent.      ASSESSMENT/PLAN Cody Guerrero is a 41 y.o. male with history of previous stroke and hyperlipidemia, presenting with dizziness and left sided ataxia. He did not receive IV t-PA due to mild deficits.  TIA  Resultant  Symptoms resolved  CT head - essentially negative  MRI head -  Negative for acute pathology  MRA head -  Negative for acute vascular occlusion  CTA H&N - RIGHT P3 occlusion, potentially acute.  Moderate RIGHT PCA involvement.  Carotid Doppler - CTA neck  2D Echo -  No thrombus seen  Will need TEE and loop  LDL - 67  HgbA1c - 5.2  VTE prophylaxis - subcutaneous heparin Diet Heart Room service appropriate? Yes; Fluid consistency: Thin  aspirin 325 mg daily prior to admission,now on ASA 325mg   Patient counseled to be compliant with his antithrombotic medications  Ongoing aggressive stroke risk factor management  Therapy recommendations:  Outpatient OT and PT  recommended.  Disposition:  Discharge with follow up in 3 days with Dr. Roda Shutters  Hypertension  Stable - occasional mild low blood pressures  Permissive hypertension (OK if < 220/120) but gradually normalize in 5-7 days  Long-term BP goal normotensive  Hyperlipidemia  Home meds:  Lipitor 40 mg daily resumed in hospital  LDL 67, goal < 70  Continue statin at discharge    Other Stroke Risk Factors  Obesity, Body mass index is 33.12 kg/m., recommend weight loss, diet and exercise as appropriate   Hx stroke/TIA    Other Active Problems  Leukocytosis - 15.8 (afebrile) - recheck in  a.m.  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 He had elevated LDL. 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 Discussed embolic causes with patient and wife vs worsening of old cerebellar symptoms but suspect this was a TIA. Will keep him on ASA325mg  and have scheduled him for follow up with Dr. Roda Shutters 12/11/2016 (3 days) at 8am outpatient.   Personally examined patient and images, and have participated in and made any corrections needed to history, physical, neuro exam,assessment and plan as stated above.  I have personally obtained the history, evaluated lab date, reviewed imaging studies and agree with radiology interpretations.       To contact Stroke Continuity provider, please refer to [email or web address]. After hours, contact General Neurology

## 2016-12-08 NOTE — Discharge Instructions (Signed)
Follow with Primary MD Juanita Lasteripton, [name], MD in 7 days  - Have your PCP check PCP, BMP during next visit   Activity: As tolerated    Disposition Home    Diet: Heart Healthy    On your next visit with your primary care physician please Get Medicines reviewed and adjusted.   Please request your Prim.MD to go over all Hospital Tests and Procedure/Radiological results at the follow up, please get all Hospital records sent to your Prim MD by signing hospital release before you go home.   If you experience worsening of your admission symptoms, develop shortness of breath, life threatening emergency, suicidal or homicidal thoughts you must seek medical attention immediately by calling 911 or calling your MD immediately  if symptoms less severe.  You Must read complete instructions/literature along with all the possible adverse reactions/side effects for all the Medicines you take and that have been prescribed to you. Take any new Medicines after you have completely understood and accpet all the possible adverse reactions/side effects.   Do not drive, operating heavy machinery, perform activities at heights, swimming or participation in water activities or provide baby sitting services if your were admitted for syncope or siezures until you have seen by Primary MD or a Neurologist and advised to do so again.  Do not drive when taking Pain medications.    Do not take more than prescribed Pain, Sleep and Anxiety Medications  Special Instructions: If you have smoked or chewed Tobacco  in the last 2 yrs please stop smoking, stop any regular Alcohol  and or any Recreational drug use.  Wear Seat belts while driving.   Please note  You were cared for by a hospitalist during your hospital stay. If you have any questions about your discharge medications or the care you received while you were in the hospital after you are discharged, you can call the unit and asked to speak with the hospitalist on  call if the hospitalist that took care of you is not available. Once you are discharged, your primary care physician will handle any further medical issues. Please note that NO REFILLS for any discharge medications will be authorized once you are discharged, as it is imperative that you return to your primary care physician (or establish a relationship with a primary care physician if you do not have one) for your aftercare needs so that they can reassess your need for medications and monitor your lab values.

## 2016-12-08 NOTE — Progress Notes (Signed)
Patient ready for discharge to home; discharge instructions given and reviewed; patient discharged out via wheelchair accompanied home by his wife.

## 2016-12-11 ENCOUNTER — Encounter: Payer: Self-pay | Admitting: Neurology

## 2016-12-11 ENCOUNTER — Ambulatory Visit (INDEPENDENT_AMBULATORY_CARE_PROVIDER_SITE_OTHER): Payer: Commercial Managed Care - PPO | Admitting: Neurology

## 2016-12-11 VITALS — BP 122/80 | HR 91 | Ht 73.0 in | Wt 249.8 lb

## 2016-12-11 DIAGNOSIS — G450 Vertebro-basilar artery syndrome: Secondary | ICD-10-CM | POA: Diagnosis not present

## 2016-12-11 DIAGNOSIS — Z8673 Personal history of transient ischemic attack (TIA), and cerebral infarction without residual deficits: Secondary | ICD-10-CM | POA: Diagnosis not present

## 2016-12-11 DIAGNOSIS — E785 Hyperlipidemia, unspecified: Secondary | ICD-10-CM | POA: Diagnosis not present

## 2016-12-11 DIAGNOSIS — G4733 Obstructive sleep apnea (adult) (pediatric): Secondary | ICD-10-CM | POA: Diagnosis not present

## 2016-12-11 DIAGNOSIS — G4730 Sleep apnea, unspecified: Secondary | ICD-10-CM | POA: Insufficient documentation

## 2016-12-11 NOTE — Progress Notes (Signed)
NEUROLOGY CLINIC NEW PATIENT NOTE  NAME: Marie Chow DOB: [birth year] REFERRING PHYSICIAN: Tamala Julian, MD  I saw Cody Guerrero as a new consult in the neurovascular clinic today regarding  Chief Complaint  Patient presents with  . New Patient (Initial Visit)    Hospital follow up for stroke, ataxia, and dizzy spells from May 2018, Pt had stroke in  2014, and saw [facility] for his stroke  .  HPI: Cody Guerrero is a 41 y.o. male with PMH of HLD and previous cerebellar stroke in 2014 who presents as a new patient for a stroke.   In 06/2013 one day he woke up in the morning went to bathroom, had feeling of head explosion, back to bed, had vertigo with N/V. Went to see PCP, told to have flu and sent back home. Second day, symptoms getting worse with left sided numbness, not able to walk, went to ER and had MRI showed left cerebellar infarct. MRA showed distal left VA high grade stenosis. Stayed in hospital for 2 days, LDL was a little high and put on lipitor 80 and ASA 347m. He was not told about what caused  His stroke. Later his lipitor was decreased to 493mas his LDL was under control. He denies any head trauma or aggressive work up before the episode.  Since then, he worked out in gym 4 days a week with moderate intensity, not aggressive work out. But his work needs him to walk almost 12-15 miles a day. He noticed sometimes if he turns too quick, he will get dizzy, but will resolve in 15-20 seconds.   Last Friday, he was standing and talking to people at his work, he felt sudden onset dizziness, but able to walk down the hall to his office. He sat down for a few minutes hoping it would resolve. However, it didn't but also started to have left sided tingling, left hand dropping cell phone, sweating, HA, left eye seeing wave lines. [facility] and found he was pale at that time and his BP was 110/80. The symptoms lasted about 3081mand  gradually resolved. 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 No acute findings. EF 55-60%. LDL 67 and A1C 5.2. He was continued on ASA and lipitor and scheduled today for follow up.   2 week prior to current episode, he had severe HA and had to go home from work and took ibuprofen. As his wife stated, his BP at home normally 90-100/50-60. He drinks a lot of water according to himself. Denies smoking, alcohol or illicit drugs.   Past Medical History:  Diagnosis Date  . Family history of adverse reaction to anesthesia    MOTHER  . Hyperlipemia   [facility]  Past Surgical History:  Procedure Laterality Date  . HERNIA REPAIR  08/2015   History reviewed. No pertinent family history. Current Outpatient Prescriptions  Medication Sig Dispense Refill  . aspirin 325 MG EC tablet Take 325 mg by mouth daily.    . aMarland Kitchenorvastatin (LIPITOR) 40 MG tablet Take 40 mg by mouth daily.    . aMarland Kitchenorvastatin (LIPITOR) 40 MG tablet Take 40 mg by mouth daily.    . bMarland Kitchencomplex vitamins tablet Take 1 tablet by mouth daily.    . meclizine (ANTIVERT) 25 MG tablet Take 12.5 mg by mouth 3 (three) times daily  as needed for dizziness.     . Omega-3 Fatty Acids (FISH OIL) 1000 MG CAPS Take 1 capsule by mouth daily.     No current facility-administered medications for this visit.    No Known Allergies Social History   Social History  . Marital status: Married    Spouse name: N/A  . Number of children: N/A  . Years of education: N/A   Occupational History  . Not on file.   Social History Main Topics  . Smoking status: Never Smoker  . Smokeless tobacco: Never Used  . Alcohol use No  . Drug use: No  . Sexual activity: Not on file   Other Topics Concern  . Not on file   Social History Narrative  . No narrative on file    Review of Systems Full 14 system review of systems performed and notable only for those listed, all others are neg:    Constitutional:   Cardiovascular:  Ear/Nose/Throat:   Skin:  Eyes:  Blurry vision Respiratory:   Gastroitestinal:   Genitourinary:  Hematology/Lymphatic:  Bruise easily Endocrine:  Musculoskeletal:  Neck stiffness Allergy/Immunology:   Neurological:  Dizziness, HA, weakness Psychiatric: agitation Sleep: frequent waking, daytime sleepiness, snoring   Physical Exam  Vitals:   12/11/16 0759  BP: 122/80  Pulse: 91    General - Well nourished, well developed, in no apparent distress.  Ophthalmologic - Sharp disc margins OU.  Cardiovascular - Regular rate and rhythm with no murmur. Carotid pulses were 2+ without bruits .   Neck - supple, no nuchal rigidity .  Mental Status -  Level of arousal and orientation to time, place, and person were intact. Language including expression, naming, repetition, comprehension, reading, and writing was assessed and found intact. Attention span and concentration were normal. Recent and remote memory were intact. Fund of Knowledge was assessed and was intact.  Cranial Nerves II - XII - II - Visual field intact OU. III, IV, VI - Extraocular movements intact. V - Facial sensation intact bilaterally. VII - Facial movement intact bilaterally. VIII - Hearing & vestibular intact bilaterally. X - Palate elevates symmetrically. XI - Chin turning & shoulder shrug intact bilaterally. XII - Tongue protrusion intact.  Motor Strength - The patient's strength was normal in all extremities and pronator drift was absent.  Bulk was normal and fasciculations were absent.   Motor Tone - Muscle tone was assessed at the neck and appendages and was normal.  Reflexes - The patient's reflexes were normal in all extremities and he had no pathological reflexes.  Sensory - Light touch, temperature/pinprick, vibration and proprioception, and Romberg testing were assessed and were normal.    Coordination - The patient had normal movements in the hands and feet  with no ataxia or dysmetria.  Tremor was absent.  Gait and Station - The patient's transfers, posture, gait, station, and turns were observed as normal.   Imaging  I have personally reviewed the radiological images below and agree with the radiology interpretations. Red text is my interpretation  Ct Angio Head W Or Wo Contrast 12/06/2016 IMPRESSION: CTA neck:  Negative. Moderate C6-7 canal stenosis. CTA HEAD: RIGHT P3 occlusion, potentially acute given patient's symptoms. Mild intracranial atherosclerosis, moderate RIGHT PCA involvement. 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 Negative for bleed or other acute intracranial process. 2. Ethmoid sinus disease.    Mr Brain Wo Contrast 12/07/2016 IMPRESSION: No acute posterior circulation abnormality is evident. There is  a chronic infarct of the LEFT superior cerebellum. No proximal vascular occlusion. No acute intracranial findings. Mild subcortical white matter disease, likely chronic microvascular ischemic change.   Lab Review Component     Latest Ref Rng & Units 12/06/2016 12/07/2016  Cholesterol     0 - 200 mg/dL  130  Triglycerides     <150 mg/dL  98  HDL Cholesterol     >40 mg/dL  43  Total CHOL/HDL Ratio     RATIO  3.0  VLDL     0 - 40 mg/dL  20  LDL (calc)     0 - 99 mg/dL  67  Hemoglobin A1C     4.8 - 5.6 %  5.2  Mean Plasma Glucose     mg/dL  103  HIV     Non Reactive Non Reactive       Assessment and Plan:   In summary, Cody Guerrero is a 41 y.o. male with PMH of HLD and previous cerebellar stroke in 2014 who presents as a new patient for stroke. He had left SCA infarct in 2014 with MRA showed left distal VA high grade stenosis, 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 He has no stroke risk factors. 12/06/16 pt had similar episode of vertigo, left sided numbness, but also HA, left visual wave lines, lasting 30 min. MRI negative for stroke and CTA head and neck stable left VA distal  stenosis. TTE, LDL and A1C WNL. The current episode concerning for TIA due to hypoperfusion as pt BP was relatively low at baseline. DDx includes basilar type migraine. Embolic event unlikely due to young age, no risk factor and occurred as same vessel territory. 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   - continue ASA and lipitor for stroke prevention - will do sleep study to rule out sleep apnea - Follow up with your primary care physician for stroke risk factor modification. Recommend maintain blood pressure goal <130/80, diabetes with hemoglobin A1c goal below 7.0% and lipids with LDL cholesterol goal below 70 mg/dL.  - keep hydrate and avoid low BP - check BP at home and record - avoid abrupt neck turning or aggressive work out - follow up in 4 months.   I recommend aggressive blood pressure control with a goal <130/80 mm Hg.  Lipids should be managed intensively, with a goal LDL < 70 mg/dL.  I encouraged the patient to discuss these important issues with his primary care physician.  I counseled the patient on measures to reduce stroke risk, including the importance of medication compliance, risk factor control, exercise, healthy diet, and avoidance of smoking.  I reviewed stroke warning signs and symptoms and appropriate actions to take if such occurs.   Thank you very much for the opportunity to participate in the care of this patient.  Please do not hesitate to call if any questions or concerns arise.  Orders Placed This Encounter  Procedures  . Ambulatory referral to Sleep Studies    Referral Priority:   Routine    Referral Type:   Consultation    Referral Reason:   Specialty Services Required    Number of Visits Requested:   1    Meds ordered this encounter  Medications  . atorvastatin (LIPITOR) 40 MG tablet    Sig: Take 40 mg by mouth daily.    Patient Instructions  - continue ASA and lipitor for stroke prevention - will do sleep study to  rule out sleep apnea - Follow  up with your primary care physician for stroke risk factor modification. Recommend maintain blood pressure goal <130/80, diabetes with hemoglobin A1c goal below 7.0% and lipids with LDL cholesterol goal below 70 mg/dL.  - keep hydrate and avoid low BP - check BP at home and record - avoid abrupt neck turning or aggressive work out - follow up in 4 months.    Rosalin Hawking, MD PhD [facility] [address], [place], [place] [address] [phone]

## 2016-12-11 NOTE — Patient Instructions (Signed)
-   continue ASA and lipitor for stroke prevention - will do sleep study to rule out sleep apnea - Follow up with your primary care physician for stroke risk factor modification. Recommend maintain blood pressure goal <130/80, diabetes with hemoglobin A1c goal below 7.0% and lipids with LDL cholesterol goal below 70 mg/dL.  - keep hydrate and avoid low BP - check BP at home and record - avoid abrupt neck turning or aggressive work out - follow up in 4 months.

## 2016-12-23 ENCOUNTER — Telehealth: Payer: Self-pay | Admitting: Neurology

## 2016-12-23 NOTE — Telephone Encounter (Signed)
Rn call patient back about dizzy spells. Pt was just seen 12/11/2016 by Dr. Roda ShuttersXu for stroke.dizzy spells. PT stated Dr. Roda ShuttersXu told him to return to work but he is still dizzy. Pt stated he saw his PCP,and they told him to stay out of work until he feels better.The PCP extended his leave time not Dr. Roda ShuttersXu. Pt stated sometimes he gets blurred vision and than it stops. Pt wanted an appt with Dr. Roda ShuttersXu. Rn stated Dr. Roda ShuttersXu is on vacation this week. Pt requested Dr. Lucia GaskinsAhern. Rn stated Dt. Lucia Gaskinshern has no availability today. Rn stated he can see the NP who works with Dr. Roda ShuttersXu and Dr Pearlean BrownieSethi. PT schedule for tomorrow with NP. PT verbalized understanding.

## 2016-12-23 NOTE — Telephone Encounter (Signed)
Please contact patient at [phone]

## 2016-12-23 NOTE — Telephone Encounter (Signed)
Patient saw Dr. Roda ShuttersXu 12-11-16 and still has dizziness even sitting down since the appointment.

## 2016-12-24 ENCOUNTER — Ambulatory Visit (INDEPENDENT_AMBULATORY_CARE_PROVIDER_SITE_OTHER): Payer: Commercial Managed Care - PPO | Admitting: Nurse Practitioner

## 2016-12-24 ENCOUNTER — Encounter: Payer: Self-pay | Admitting: Nurse Practitioner

## 2016-12-24 VITALS — BP 115/81 | HR 80 | Ht 73.0 in | Wt 253.0 lb

## 2016-12-24 DIAGNOSIS — I639 Cerebral infarction, unspecified: Secondary | ICD-10-CM | POA: Diagnosis not present

## 2016-12-24 DIAGNOSIS — G450 Vertebro-basilar artery syndrome: Secondary | ICD-10-CM

## 2016-12-24 DIAGNOSIS — E785 Hyperlipidemia, unspecified: Secondary | ICD-10-CM

## 2016-12-24 DIAGNOSIS — Z8673 Personal history of transient ischemic attack (TIA), and cerebral infarction without residual deficits: Secondary | ICD-10-CM

## 2016-12-24 DIAGNOSIS — R2990 Unspecified symptoms and signs involving the nervous system: Secondary | ICD-10-CM

## 2016-12-24 NOTE — Patient Instructions (Signed)
continue ASA and lipitor for stroke prevention sleep study to rule out sleep apnea has been scheduled Follow up with your primary care physician for stroke risk factor modification. Recommend maintain blood pressure goal <130/80, diabetes with hemoglobin A1c goal below 7.0% and lipids with LDL cholesterol goal below 70 mg/dL.  keep hydrate and avoid low BP check BP at home and record bring to next visit - avoid abrupt neck turning or aggressive work out Will check TEE and 30 day cardiac event monitor follow up in 2 months with Dr. Roda ShuttersXu.

## 2016-12-24 NOTE — Progress Notes (Signed)
[facility]  PATIENT: Cody Guerrero DOB: July 12, 1976   REASON FOR VISIT: Follow-up for another episode of dizziness, TIA in May 2018 HISTORY FROM: Patient and wife    HISTORY OF PRESENT ILLNESS:UPDATE 06/12/2018[name], 41 year old male returns for follow-up with history of stroke in 2014 and TIA in May 2018. 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 No acute findings. EF 55-60%. LDL 67 and A1C 5.2. He was continued on ASA and lipitor He had another event  dizziness accompanied by blurred vision and ringing in his ears while sitting in the chair this past Saturday which lasted for approximately 3 hours.  He was admitted 12/06/2016 for TIA and follow-up with Dr. Erlinda Hong.  12/11/2016 . Patient has not returned to work due to his intermittent dizziness episodes. His job requires him to climb stairs and ladders and walk for at least 12 miles a day. He has not had TEE or cardiac event monitoring that was suggested in the hospital. He has been scheduled for a sleep study. He returns for reevaluation. 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 male with PMH of HLD and previous cerebellar stroke in 2014 who presents as a new patient for a stroke.   In 06/2013 one day he woke up in the morning went to bathroom, had feeling of head explosion, back to bed, had vertigo with N/V. Went to see PCP, told to have flu and sent back home. Second day, symptoms getting worse with left sided numbness, not able to walk, went to ER and had MRI showed left cerebellar infarct. MRA showed distal left VA high grade stenosis. Stayed in hospital for 2 days, LDL was a little high and put on lipitor 80 and ASA 377m. He was not told about what caused  His stroke. Later his lipitor was decreased to 430mas his LDL was under control. He denies any head trauma or aggressive work up before the episode.  Since then, he  worked out in gym 4 days a week with moderate intensity, not aggressive work out. But his work needs him to walk almost 12-15 miles a day. He noticed sometimes if he turns too quick, he will get dizzy, but will resolve in 15-20 seconds.   Last Friday, he was standing and talking to people at his work, he felt sudden onset dizziness, but able to walk down the hall to his office. He sat down for a few minutes hoping it would resolve. However, it didn't but also started to have left sided tingling, left hand dropping cell phone, sweating, HA, left eye seeing wave lines. [facility] and found he was pale at that time and his BP was 110/80. The symptoms lasted about 3047mand gradually resolved. 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 No acute findings. EF 55-60%. LDL 67 and A1C 5.2. He was continued on ASA and lipitor and scheduled today for follow up.   2 week prior to current episode, he had severe HA and had to go home from work and took ibuprofen. As his wife stated, his BP at home normally 90-100/50-60. He drinks a lot of water according to himself. Denies smoking, alcohol or illicit drugs.    REVIEW OF SYSTEMS: Full 14 system review of systems performed and notable  only for those listed, all others are neg:  Constitutional: Fatigue  Cardiovascular: neg Ear/Nose/Throat: Ringing in the ears  Skin: neg Eyes: Blurred vision Respiratory: neg Gastroitestinal: neg  Hematology/Lymphatic: neg  Endocrine: neg Musculoskeletal: Neck stiffness Allergy/Immunology: neg Neurological: Dizziness Psychiatric: neg Sleep : Frequent wakening, snoring   ALLERGIES: No Known Allergies  HOME MEDICATIONS: Outpatient Medications Prior to Visit  Medication Sig Dispense Refill  . aspirin 325 MG EC tablet Take 325 mg by mouth daily.    Marland Kitchen atorvastatin (LIPITOR) 40 MG tablet Take 40 mg by mouth daily.    Marland Kitchen b  complex vitamins tablet Take 1 tablet by mouth daily.    . meclizine (ANTIVERT) 25 MG tablet Take 12.5 mg by mouth 3 (three) times daily as needed for dizziness.     . Omega-3 Fatty Acids (FISH OIL) 1000 MG CAPS Take 1 capsule by mouth daily.    Marland Kitchen atorvastatin (LIPITOR) 40 MG tablet Take 40 mg by mouth daily.     No facility-administered medications prior to visit.     PAST MEDICAL HISTORY: Past Medical History:  Diagnosis Date  . Family history of adverse reaction to anesthesia    MOTHER  . Hyperlipemia   . Stroke [facility])     PAST SURGICAL HISTORY: Past Surgical History:  Procedure Laterality Date  . HERNIA REPAIR  08/2015    FAMILY HISTORY: Family History  Problem Relation Age of Onset  . Breast cancer Mother     SOCIAL HISTORY: Social History   Social History  . Marital status: Married    Spouse name: N/A  . Number of children: N/A  . Years of education: N/A   Occupational History  . Not on file.   Social History Main Topics  . Smoking status: Never Smoker  . Smokeless tobacco: Never Used  . Alcohol use No  . Drug use: No  . Sexual activity: Not on file   Other Topics Concern  . Not on file   Social History Narrative   Lives at home w/ his wife and 3 kids   Right-handed   Caffeine: rare        PHYSICAL EXAM  Vitals:   12/24/16 1121 12/24/16 1122  BP: 119/77 115/81  Pulse: 64 80  Weight: 253 lb (114.8 kg)   Height: 6' 1"  (1.854 m)    Body mass index is 33.38 kg/m.  Generalized: Well developed, Obese male in no acute distress  Head: normocephalic and atraumatic,. Oropharynx benign  Neck: Supple, no carotid bruits  Cardiac: Regular rate rhythm, no murmur  Musculoskeletal: No deformity   Neurological examination   Mentation: Alert oriented to time, place, history taking. Attention span and concentration appropriate. Recent and remote memory intact.  Follows all commands speech and language fluent.   Cranial nerve II-XII: Fundoscopic exam  reveals sharp disc margins.Pupils were equal round reactive to light extraocular movements were full, visual field were full on confrontational test. Facial sensation and strength were normal. hearing was intact to finger rubbing bilaterally. Uvula tongue midline. head turning and shoulder shrug were normal and symmetric.Tongue protrusion into cheek strength was normal. Motor: normal bulk and tone, full strength in the BUE, BLE, fine finger movements normal, no pronator drift. No focal weakness Sensory: normal and symmetric to light touch, pinprick, and  Vibration, in the upper and lower extremities Coordination: finger-nose-finger, heel-to-shin bilaterally, no dysmetria Reflexes: Brachioradialis 2/2, biceps 2/2, triceps 2/2, patellar 2/2, Achilles 2/2, plantar responses were flexor bilaterally. Gait and Station: Rising up from  seated position without assistance, normal stance,  moderate stride, good arm swing, smooth turning, able to perform tiptoe, and heel walking without difficulty. Tandem gait is steady  DIAGNOSTIC DATA (LABS, IMAGING, TESTING) - I reviewed patient records, labs, notes, testing and imaging myself where available.  Lab Results  Component Value Date   WBC 8.1 12/08/2016   HGB 15.0 12/08/2016   HCT 45.5 12/08/2016   MCV 86.5 12/08/2016   PLT 168 12/08/2016      Component Value Date/Time   NA 140 12/06/2016 1140   K 3.6 12/06/2016 1140   CL 105 12/06/2016 1140   CO2 26 12/06/2016 1140   GLUCOSE 108 (H) 12/06/2016 1140   BUN 14 12/06/2016 1140   CREATININE 0.88 12/06/2016 1807   CALCIUM 8.8 (L) 12/06/2016 1140   PROT 7.2 12/06/2016 1140   ALBUMIN 4.3 12/06/2016 1140   AST 32 12/06/2016 1140   ALT 42 12/06/2016 1140   ALKPHOS 78 12/06/2016 1140   BILITOT 0.8 12/06/2016 1140   GFRNONAA >60 12/06/2016 1807   GFRAA >60 12/06/2016 1807   Lab Results  Component Value Date   CHOL 130 12/07/2016   HDL 43 12/07/2016   LDLCALC 67 12/07/2016   TRIG 98 12/07/2016    CHOLHDL 3.0 12/07/2016   Lab Results  Component Value Date   HGBA1C 5.2 12/07/2016    ASSESSMENT AND PLAN He had left SCA infarct in 2014 with MRA showed left distal VA high grade stenosis, 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 He has no stroke risk factors. 12/06/16 pt had similar episode of vertigo, left sided numbness, but also HA, left visual wave lines, lasting 30 min. MRI negative for stroke and CTA head and neck stable left VA distal stenosis. TTE, LDL and A1C WNL. The current episode concerning for TIA due to hypoperfusion as pt BP was relatively low at baseline. DDx includes basilar type migraine. Embolic event unlikely due to young age, no risk factor and occurred as same vessel territory. 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 Complete stroke workup with Cardiac event monitor and TEE. The patient is a current patient of Dr. Erlinda Hong  who is out of the office today . This note is sent to the work in doctor.     PLAN: continue ASA and lipitor for stroke prevention sleep study to rule out sleep apnea has been scheduled Follow up with your primary care physician for stroke risk factor modification. Recommend maintain blood pressure goal <130/80, diabetes with hemoglobin A1c goal below 7.0% and lipids with LDL cholesterol goal below 70 mg/dL.  keep hydrate and avoid low BP check BP at home and record bring to next visit - avoid abrupt neck turning or aggressive work out Will check TEE and cardiac event monitor to complete stroke workup follow up in 2 months with Dr. Erlinda Hong.  I spent 30 minutes in total face to face time with the patient more than 50% of which was spent counseling and coordination of care, reviewing test results reviewing medications, reviewing Hospital record and discussing and reviewing the diagnosis of TIA and further treatment options. , Rayburn Ma, [facility], APRN  [facility] [address] [place], [place]  [address] [phone]

## 2016-12-26 ENCOUNTER — Ambulatory Visit (INDEPENDENT_AMBULATORY_CARE_PROVIDER_SITE_OTHER): Payer: Commercial Managed Care - PPO | Admitting: Neurology

## 2016-12-26 ENCOUNTER — Encounter: Payer: Self-pay | Admitting: Neurology

## 2016-12-26 VITALS — BP 110/74 | HR 70 | Ht 73.0 in | Wt 252.0 lb

## 2016-12-26 DIAGNOSIS — I639 Cerebral infarction, unspecified: Secondary | ICD-10-CM

## 2016-12-26 DIAGNOSIS — G459 Transient cerebral ischemic attack, unspecified: Secondary | ICD-10-CM | POA: Diagnosis not present

## 2016-12-26 DIAGNOSIS — R0683 Snoring: Secondary | ICD-10-CM | POA: Diagnosis not present

## 2016-12-26 DIAGNOSIS — G450 Vertebro-basilar artery syndrome: Secondary | ICD-10-CM | POA: Diagnosis not present

## 2016-12-26 DIAGNOSIS — G4719 Other hypersomnia: Secondary | ICD-10-CM

## 2016-12-26 NOTE — Patient Instructions (Signed)

## 2016-12-26 NOTE — Progress Notes (Signed)
Subjective:    Patient ID: Cody Guerrero is a 41 y.o. male.  HPI     Cody Foley, MD, PhD [facility] [place] [facility] [address] [address] [place], [place] [address]  Dear Cody Guerrero and Cody Guerrero,   I saw your patient, Cody Guerrero, upon your kind request in my clinic today for initial consultation of his sleep disorder, in particular, concern for underlying obstructive sleep apnea. The patient is accompanied by his wife today. 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 I reviewed your office note from 09/23/2016. His Epworth sleepiness score is 13 out of 24, fatigue score is 29 out of 63. He has difficulty maintaining sleep. He lives at home with wife and 3 children. He is a nonsmoker and does not drink alcohol, denies illicit drug use and does not drink caffeine on a daily basis.   Of note, he had a stroke in 2014, MRI brain wo contrast on 12/07/16 showed: IMPRESSION: No acute posterior circulation abnormality is evident. There is a chronic infarct of the LEFT superior cerebellum. No proximal vascular occlusion.   No acute intracranial findings. Mild subcortical white matter disease, likely chronic microvascular ischemic change.   Of note, he has woken up with a sense of gasping for air. His wife has noted pauses in his breathing. They don't always sleep in the same bed because she has a different work schedule and is bothered by his loud snoring often. He denies restless leg symptoms, he has nocturia about once per average night, rare morning headaches. He works as a Education officer, environmental at a school. Bedtime is typically around 9, wakeup time around 5:15 AM. He suspects that his father has sleep apnea.  His Past Medical History Is  Significant For: Past Medical History:  Diagnosis Date  . Family history of adverse reaction to anesthesia    MOTHER  . Hyperlipemia   . Stroke [facility])     His Past Surgical History Is Significant For: Past Surgical History:  Procedure Laterality Date  . HERNIA REPAIR  08/2015    His Family History Is Significant For: Family History  Problem Relation Age of Onset  . Breast cancer Mother     His Social History Is Significant For: Social History   Social History  . Marital status: Married    Spouse name: N/A  . Number of children: N/A  . Years of education: N/A   Social History Main Topics  . Smoking status: Never Smoker  . Smokeless tobacco: Never Used  . Alcohol use No  . Drug use: No  . Sexual activity: Not Asked   Other Topics Concern  . None   Social History Narrative   Lives at home w/ his wife and 3 kids   Right-handed   Caffeine: rare       His Allergies Are:  No Known Allergies:   His Current Medications Are:  Outpatient Encounter Prescriptions as of 12/26/2016  Medication Sig  . aspirin 325 MG EC tablet Take 325 mg by mouth daily.  Marland Kitchen atorvastatin (LIPITOR) 40 MG tablet Take 40 mg by mouth daily.  Marland Kitchen b complex vitamins tablet Take 1 tablet by mouth daily.  . meclizine (ANTIVERT) 25 MG tablet Take 12.5 mg by mouth 3 (three) times daily as needed for dizziness.   Marland Kitchen  Omega-3 Fatty Acids (FISH OIL) 1000 MG CAPS Take 1 capsule by mouth daily.   No facility-administered encounter medications on file as of 12/26/2016.   :  Review of Systems:  Out of a complete 14 point review of systems, all are reviewed and negative with the exception of these symptoms as listed below:  Review of Systems  Neurological:       Pt presents today to discuss his sleep. Pt has a history of stroke. Pt has never had a sleep study but does endorse snoring.  Epworth Sleepiness Scale 0= would never doze 1= slight chance of dozing 2= moderate chance of dozing 3= high chance of  dozing  Sitting and reading: 2 Watching TV: 3 Sitting inactive in a public place (ex. Theater or meeting): 1 As a passenger in a car for an hour without a break: 1 Lying down to rest in the afternoon: 3 Sitting and talking to someone: 0 Sitting quietly after lunch (no alcohol): 3 In a car, while stopped in traffic: 0 Total: 13     Objective:  Neurologic Exam  Physical Exam Physical Examination:   Vitals:   12/26/16 0907  BP: 110/74  Pulse: 70    General Examination: The patient is a very pleasant 41 y.o. male in no acute distress. He appears well-developed and well-nourished and well groomed.   HEENT: Normocephalic, atraumatic, pupils are equal, round and reactive to light and accommodation. Extraocular tracking is good without limitation to gaze excursion or nystagmus noted. Normal smooth pursuit is noted. Hearing is grossly intact. Face is symmetric with normal facial animation and normal facial sensation. Speech is clear with no dysarthria noted. There is no hypophonia. There is no lip, neck/head, jaw or voice tremor. Neck is supple with full range of passive and active motion. There are no carotid bruits on auscultation. Oropharynx exam reveals: mild mouth dryness, adequate dental hygiene with full dentures on top and partials on the bottom and moderate airway crowding, due to tonsils in place, about 1+ in size, left more prominent than right, floppy appearing soft palate, longer uvula. Mallampati is class II. Tongue protrudes centrally and palate elevates symmetrically. Neck size is 17.25 inches. He has a Mild overbite. Nasal inspection reveals no significant nasal mucosal bogginess or redness and no septal deviation, R nostril wider than L.   Chest: Clear to auscultation without wheezing, rhonchi or crackles noted.  Heart: S1+S2+0, regular and normal without murmurs, rubs or gallops noted.   Abdomen: Soft, non-tender and non-distended with normal bowel sounds appreciated on  auscultation.  Extremities: There is no pitting edema in the distal lower extremities bilaterally. Pedal pulses are intact.  Skin: Warm and dry without trophic changes noted.  Musculoskeletal: exam reveals no obvious joint deformities, tenderness or joint swelling or erythema.   Neurologically:  Mental status: The patient is awake, alert and oriented in all 4 spheres. His immediate and remote memory, attention, language skills and fund of knowledge are appropriate. There is no evidence of aphasia, agnosia, apraxia or anomia. Speech is clear with normal prosody and enunciation. Thought process is linear. Mood is normal and affect is normal.  Cranial nerves II - XII are as described above under HEENT exam. In addition: shoulder shrug is normal with equal shoulder height noted. Motor exam: Normal bulk, strength and tone is noted. There is no drift, tremor or rebound. Romberg is negative except for mild sway. Reflexes are 2+ throughout. Fine motor skills and coordination: intact with normal finger taps, normal  hand movements, normal rapid alternating patting, normal foot taps and normal foot agility.  Cerebellar testing: No dysmetria or intention tremor on finger to nose testing. Heel to shin is unremarkable bilaterally. There is no truncal or gait ataxia. Some hesitation noted on the left with finger to nose and heel to shin. Sensory exam: intact to light touch in the upper and lower extremities.  Gait, station and balance: He stands easily. No veering to one side is noted. No leaning to one side is noted. Posture is age-appropriate and stance is narrow based. Gait shows normal stride length and normal pace. No problems turning are noted. Tandem walk is unremarkable.            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 I had a long chat with the patient and his wife about my findings and the diagnosis of OSA, its prognosis and treatment options. We talked about medical treatments, surgical interventions and non-pharmacological approaches. I explained in particular the risks and ramifications of untreated moderate to severe OSA, especially with respect to developing cardiovascular disease down the Road, including congestive heart failure, difficult to treat hypertension, cardiac arrhythmias, or stroke. Even type 2 diabetes has, in part, been linked to untreated OSA. Symptoms of untreated OSA include daytime sleepiness, memory problems, mood irritability and mood disorder such as depression and anxiety, lack of energy, as well as recurrent headaches, especially morning headaches. We talked about trying to maintain a healthy lifestyle in general, as well as the importance of weight control. I encouraged the patient to eat healthy, exercise daily and keep well hydrated, to keep a scheduled bedtime and wake time routine, to not skip any meals and eat healthy snacks in between meals. I advised the patient not to drive when feeling sleepy.  I recommended the following at this time: sleep study with potential positive airway pressure titration. (We will score hypopneas at 4%).   I explained the sleep test procedure to the patient and also outlined possible surgical and non-surgical treatment options of OSA, including the use of a custom-made dental device (which would require a referral to a specialist dentist or oral surgeon), upper airway surgical options, such as pillar implants, radiofrequency surgery, tongue base surgery, and UPPP (which would involve a referral to an ENT surgeon). Rarely, jaw surgery such as mandibular advancement may be considered.  I also explained the CPAP treatment option to the patient, who  indicated that he would be willing to try CPAP if the need arises. I explained the importance of being compliant with PAP treatment, not only for insurance purposes but primarily to improve His symptoms, and for the patient's long term health benefit, including to reduce His cardiovascular risks. I answered all their questions today and the patient and his wife were in agreement. I would like to see him back after the sleep study is completed and encouraged him to call with any interim questions, concerns, problems or updates.   Thank you very much for allowing me to participate in the care of this nice patient. If I can be of any further assistance to you please do not hesitate to talk to me.  Sincerely,   Cody FoleySaima Brolin Dambrosia, MD, PhD

## 2016-12-27 ENCOUNTER — Telehealth: Payer: Self-pay | Admitting: Nurse Practitioner

## 2016-12-27 DIAGNOSIS — G450 Vertebro-basilar artery syndrome: Secondary | ICD-10-CM

## 2016-12-27 NOTE — Telephone Encounter (Signed)
Order placed as requested to CD (for TEE).

## 2016-12-27 NOTE — Progress Notes (Signed)
I have reviewed and agreed above plan. 

## 2016-12-27 NOTE — Telephone Encounter (Signed)
[facility] . We need to put in a referral for cardiology and the cardiologist will order TEE. One Referral for Cardiology put Patient's need TEE. Thanks Annabelle Harmanana.

## 2016-12-31 NOTE — Telephone Encounter (Signed)
Please call pt with update on appointment for TEE

## 2017-01-01 ENCOUNTER — Ambulatory Visit (INDEPENDENT_AMBULATORY_CARE_PROVIDER_SITE_OTHER): Payer: Commercial Managed Care - PPO | Admitting: Neurology

## 2017-01-01 DIAGNOSIS — G4710 Hypersomnia, unspecified: Secondary | ICD-10-CM | POA: Diagnosis not present

## 2017-01-01 DIAGNOSIS — R0683 Snoring: Secondary | ICD-10-CM

## 2017-01-01 DIAGNOSIS — G4720 Circadian rhythm sleep disorder, unspecified type: Secondary | ICD-10-CM

## 2017-01-02 ENCOUNTER — Encounter: Payer: Self-pay | Admitting: Cardiology

## 2017-01-02 ENCOUNTER — Ambulatory Visit (INDEPENDENT_AMBULATORY_CARE_PROVIDER_SITE_OTHER): Payer: Commercial Managed Care - PPO | Admitting: Cardiology

## 2017-01-02 VITALS — BP 115/79 | HR 83 | Ht 73.0 in | Wt 257.0 lb

## 2017-01-02 DIAGNOSIS — I639 Cerebral infarction, unspecified: Secondary | ICD-10-CM

## 2017-01-02 DIAGNOSIS — Z01818 Encounter for other preprocedural examination: Secondary | ICD-10-CM | POA: Diagnosis not present

## 2017-01-02 DIAGNOSIS — E7849 Other hyperlipidemia: Secondary | ICD-10-CM

## 2017-01-02 DIAGNOSIS — G458 Other transient cerebral ischemic attacks and related syndromes: Secondary | ICD-10-CM

## 2017-01-02 DIAGNOSIS — E784 Other hyperlipidemia: Secondary | ICD-10-CM | POA: Diagnosis not present

## 2017-01-02 NOTE — Patient Instructions (Signed)
SCHEDULE AT [facility] Your physician has requested that you have an  TRANSESOPHAGEAL echocardiogram  (TEE). Echocardiography is a painless test that uses sound waves to create images of your heart. It provides your doctor with information about the size and shape of your heart and how well your heart's chambers and valves are working. This procedure takes approximately one hour. There are no restrictions for this procedure.     SCHEDULE [address] Your physician has recommended that you wear an event monitor 30 DAY. Event monitors are medical devices that record the heart's electrical activity. Doctors most often us these monitors to diagnose arrhythmias. Arrhythmias are problems with the speed or rhythm of the heartbeat. The monitor is a small, portable device. You can wear one while you do your normal daily activities. This is usually used to diagnose what is causing palpitations/syncope (passing out).   NO CHANGE WITH MEDICATIONS    Your physician recommends that you schedule a follow-up appointment in 2 MONTHS WITH DR HARDING.

## 2017-01-02 NOTE — Progress Notes (Signed)
PCP: Juanita Lasteripton, [name], MD  Clinic Note: Chief Complaint  Patient presents with  . New Evaluation    pt states he has had 2 recent TIA/ strokes - here for TEE work-up    HPI: Cody Guerrero is a 41 y.o. male who is being seen today for post-stroke evaluation (pre-TEE) at the request of Juanita Lasteripton, [name], MD & Dr. Roda ShuttersXu  [facility]A Sleep study last PM.   Cody Guerrero was last seen on December 31, 2013 by his PCP for f/u after CVA/TIA events. (notes indicate L Vert A stenosis (but CTA notes normal Vert A, occluded L PCA-P3 segment)  Recent Hospitalizations:  12/06/2016: TIA - was @ work & felt sudden onset dizziness & L-side body tingling/weakness, sweating (wife described him as being diaphoretic), noted L Eye vision disturbance with HA & dropping his cell phone--> [facility] --> [place] --> Heparin & Plavix  (Sx lasted ~30 min & "resolved") - MRI did not show acute CVA.  Also ? June 9 - ? TIA (did not go to ER) - noted dizziness & blurred vision with tinnitus - Sx lasted ~3 hr & spontaneously resolved.  Studies Personally Reviewed - (if available, images/films reviewed: From Epic Chart or Care Everywhere)  2-D Echo on 12/07/2016: Normal LV size and function. EF 55-60%. Probable mild diastolic dysfunction. Otherwise trace MR and TR.  CT Angio Head-Neck 12/06/2016: normal Ao Arch -> normal origins of Innominate, L CCA (RCCA) & LSCA. Bilat ICA patent. R Vert Dominant. Patent communicating As (Ant & Mid Cerebral As). 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 No acute finding.  Interval History: Cody Guerrero presents today with no notable CV complaints beyond his recent CVA/TIA events.  Not really having much in the way of residual Sx.Still has some baseline dizziness - poor balance. Not able to work b/c requires climbing ladders & lots of physical activity. He has not been really  active, but had been relatively asymptomatic prior to his events. Usually exercises 4-5 days / week ~60 min per day using Eliptical, weights & gym exercises. (not since last events). Usually walks ~12+ miles /day @ work Notes dizziness with sudden head movements No chest pain or shortness of breath with rest or exertion. No PND, orthopnea or edema. No palpitations, or syncope/near syncope. No TIA/amaurosis fugax symptoms since June 9 episode.  No claudication.  Told to avoid abrupt head turning.  Allow ~permissive HTN (felt that 6/9 episode was related to relative hypotension) Neurology has recommended TEE.  ROS: A comprehensive was performed. Review of Systems  Constitutional: Negative for malaise/fatigue.  HENT: Negative for nosebleeds.   Respiratory: Negative for cough, shortness of breath and wheezing.   Gastrointestinal: Negative for blood in stool and melena.  Genitourinary: Negative for hematuria.  Musculoskeletal: Negative for falls and joint pain.  Neurological: Positive for dizziness and focal weakness (none since 6/9).       Besides some dizziness - no acute Sx since 6/9  Psychiatric/Behavioral: Negative for memory loss.  All other systems reviewed and are negative.  I have reviewed and (if needed) personally updated the patient's problem list, medications, allergies, past medical and surgical history, social and family history.   Past Medical History:  Diagnosis Date  . Family history of adverse reaction to anesthesia    MOTHER  . Hyperlipemia   . 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  Past Surgical History:  Procedure Laterality Date  . HERNIA REPAIR  08/2015    Current Meds  Medication Sig  . aspirin 325 MG EC tablet Take 325 mg by mouth daily.  Marland Kitchen  atorvastatin (LIPITOR) 40 MG tablet Take 40 mg by mouth daily.  Marland Kitchen b complex vitamins tablet Take 1 tablet by mouth daily.  . meclizine (ANTIVERT) 25 MG tablet Take 12.5 mg by mouth 3 (three) times daily as needed for dizziness.   . Omega-3 Fatty Acids (FISH OIL) 1000 MG CAPS Take 2 capsules by mouth daily.     No Known Allergies  Social History   Social History  . Marital status: Married    Spouse name: N/A  . Number of children: N/A  . Years of education: N/A   Social History Main Topics  . Smoking status: Never Smoker  . Smokeless tobacco: Never Used  . Alcohol use No  . Drug use: No  . Sexual activity: Not Asked   Other Topics Concern  . None   Social History Narrative   Lives at home w/ his wife and 3 kids   Right-handed   Caffeine: rare       family history includes Breast cancer in his mother; Cancer in his maternal grandfather, maternal grandmother, and paternal grandfather; Hyperlipidemia in his father and mother.  Wt Readings from Last 3 Encounters:  01/02/17 257 lb (116.6 kg)  12/26/16 252 lb (114.3 kg)  12/24/16 253 lb (114.8 kg)    PHYSICAL EXAM BP 115/79 (BP Location: Left Arm, Patient Position: Sitting, Cuff Size: Large)   Pulse 83   Ht 6\' 1"  (1.854 m)   Wt 257 lb (116.6 kg)   BMI 33.91 kg/m  General appearance: alert, cooperative, appears stated age, no distress. Mild-moderately obese; well-nourished & well-groomed. HEENT: [place]/AT, EOMI, MMM, anicteric sclera Neck: no adenopathy, no carotid bruit and no JVD Lungs: clear to auscultation bilaterally, normal percussion bilaterally and non-labored Heart: regular rate and rhythm, S1 &S2 normal, no murmur, click, rub or gallop; non-displaced PMI Abdomen: soft, non-tender; bowel sounds normal; no masses,  no organomegaly; no HJR. Mild truncal obesity. Extremities: extremities normal, atraumatic, no cyanosis, or edema Pulses: 2+ and symmetric;  Skin: mobility and turgor normal, no evidence of bleeding or  bruising, no lesions noted and texture normal  Neurologic: Mental status: Alert & oriented x 3, thought content appropriate; non-focal exam.  Pleasant mood & affect. Cranial nerves: normal (II-XII grossly intact) - no obvious neurologic deficit except for ~ mild L sided diminshed tactile sense.    Adult ECG Report n/a  Other studies Reviewed: Additional studies/ records that were reviewed today include:  Recent Labs:   Lab Results  Component Value Date   CREATININE 0.88 12/06/2016   BUN 14 12/06/2016   NA 140 12/06/2016   K 3.6 12/06/2016   CL 105 12/06/2016   CO2 26 12/06/2016   Lab Results  Component Value Date   CHOL 130 12/07/2016   HDL 43 12/07/2016   LDLCALC 67 12/07/2016   TRIG 98 12/07/2016   CHOLHDL 3.0 12/07/2016    ASSESSMENT / PLAN: Problem List Items Addressed This Visit    Cerebellar infarct (HCC)    From 2014 - no  new event noted despite recent TIAs. Denies any palpitations to suggest Afib.  Plan TEE & 30 D event monitor      Relevant Orders   Cardiac event monitor   ECHO TEE   Hyperlipidemia    On statin - last levels are within goal      Relevant Orders   Cardiac event monitor   ECHO TEE   Transient cerebral ischemia - Primary    2 recent ~TIA events - still unclear etiology (pt seems to think that there was concern for Vert A. 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 I agree that embolic event is unlikely, however Neurology is requesting TEE &  Event Monitor.  I discussed R/B/A/I of TEE - discussed the procedure & potential risks. He agrees to proceed.  Contiue ASA & statin per Neuro recs.      Relevant Orders   Cardiac event monitor   ECHO TEE    Other Visit Diagnoses    Pre-op testing          Current medicines are reviewed at length with the patient today. (+/- concerns) n/a The following changes have been made: n/a  Patient Instructions    SCHEDULE AT [facility] Your physician has requested that you have an  TRANSESOPHAGEAL echocardiogram  (TEE). Echocardiography is a painless test that uses sound waves to create images of your heart. It provides your doctor with information about the size and shape of your heart and how well your heart's chambers and valves are working. This procedure takes approximately one hour. There are no restrictions for this procedure.     SCHEDULE [address] Your physician has recommended that you wear an event monitor 30 DAY. Event monitors are medical devices that record the heart's electrical activity. Doctors most often [place] these monitors to diagnose arrhythmias. Arrhythmias are problems with the speed or rhythm of the heartbeat. The monitor is a small, portable device. You can wear one while you do your normal daily activities. This is usually used to diagnose what is causing palpitations/syncope (passing out).   NO CHANGE WITH MEDICATIONS    Your physician recommends that you schedule a follow-up appointment in 2 MONTHS WITH DR HARDING.      Studies Ordered:   Orders Placed This Encounter  Procedures  . Cardiac event monitor  . ECHO TEE      Bryan Lemma, M.D., M.S. Interventional Cardiologist   Pager # [phone] Phone # [phone] [address] [place], [place] [address]

## 2017-01-03 ENCOUNTER — Encounter: Payer: Self-pay | Admitting: Cardiology

## 2017-01-03 NOTE — Progress Notes (Signed)
Patient referred by CM/Dr. Roda ShuttersXu, seen by me on 12/26/16, diagnostic PSG on 01/01/17.   Please call and notify the patient that the recent sleep study did not show any significant obstructive sleep apnea with the exception of snoring and mild supine OSA (lowest O2 was 91%). For this, CPAP therapy is not warranted; weight loss and avoidance of the supine sleep position are recommended. For disturbing snoring, an oral appliance (through a qualified dentist) can be considered.  Please remind patient to try to maintain good sleep hygiene, which means: Keep a regular sleep and wake schedule and make enough time for sleep (7 1/2 to 8 1/2 hours for the average adult), try not to exercise or have a meal within 2 hours of your bedtime, try to keep your bedroom conducive for sleep, that is, cool and dark, without light distractors such as an illuminated alarm clock, and refrain from watching TV right before sleep or in the middle of the night and do not keep the TV or radio on during the night. If a nightlight is used, have it away from the visual field. Also, try not to use or play on electronic devices at bedtime, such as your cell phone, tablet PC or laptop. If you like to read at bedtime on an electronic device, try to dim the background light as much as possible. Do not eat in the middle of the night. Keep pets away from the bedroom environment. For stress relief, try meditation, deep breathing exercises (there are many books and CDs available), a white noise machine or fan can help to diffuse other noise distractors, such as traffic noise. Do not drink alcohol before bedtime, as it can disturb sleep and cause middle of the night awakenings. Never mix alcohol and sedating medications! Avoid narcotic pain medication close to bedtime, as opioids/narcotics can suppress breathing drive and breathing effort.    The patient can follow-up with the referring provider, who will be notified of the test results. Once you have  spoken to patient, you can close this encounter.   Thanks,  Huston FoleySaima Ahman Dugdale, MD, PhD [facility])

## 2017-01-03 NOTE — Procedures (Signed)
PATIENT'S NAME:  Cody Guerrero, Cody Guerrero DOB:      10/23/1975      MR#:    [id number]     DATE OF RECORDING: 01/01/2017 REFERRING M.D.: Dr. 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 He has difficulty maintaining sleep. The patient endorsed the Epworth Sleepiness Scale at 13 points. The patient's weight 252 pounds with a height of 71 (inches), resulting in a BMI of 35.2 kg/m2. The patient's neck circumference measured 17.25 inches.  CURRENT MEDICATIONS: aspirin, Lipitor, B complex, Antivert, Omega 3   PROCEDURE:  This is a multichannel digital polysomnogram utilizing the Somnostar 11.2 system.  Electrodes and sensors were applied and monitored per AASM Specifications.   EEG, EOG, Chin and Limb EMG, were sampled at 200 Hz.  ECG, Snore and Nasal Pressure, Thermal Airflow, Respiratory Effort, CPAP Flow and Pressure, Oximetry was sampled at 50 Hz. Digital video and audio were recorded.      BASELINE STUDY  Lights Out was at 22:09 and Lights On at 05:01.  Total recording time (TRT) was 412 minutes, with a total sleep time (TST) of  349 minutes.   The patient's sleep latency was 15 minutes, which is normal.  REM latency was 209 minutes, which is delayed. The sleep efficiency was 84.7 %.     SLEEP ARCHITECTURE: WASO (Wake after sleep onset) was 43.5 minutes with overall mild sleep fragmentation noted and one longer period of wakefulness. There were 11 minutes in Stage N1, 219 minutes Stage N2, 73 minutes Stage N3 and 46 minutes in Stage REM.  The percentage of Stage N1 was 3.2%, Stage N2 was 62.8%, which is increased, Stage N3 was 20.9%, which is normal and Stage R (REM sleep) was 13.2%, which is decreased.  The arousals were noted as: 37 were  spontaneous, 0 were associated with PLMs, 3 were associated with respiratory events.  Audio and video analysis did not show any abnormal or unusual movements, behaviors, phonations or vocalizations. The patient took 1 bathroom break. Mild snoring was noted. The EKG was in keeping with normal sinus rhythm (NSR).  RESPIRATORY ANALYSIS:  There were a total of 4 respiratory events:  2 obstructive apneas, 0 central apneas and 0 mixed apneas with a total of 2 apneas and an apnea index (AI) of .3 /hour. There were 2 hypopneas with a hypopnea index of .3 /hour. The patient also had 0 respiratory event related arousals (RERAs).      The total APNEA/HYPOPNEA INDEX (AHI) was .7/hour and the total RESPIRATORY DISTURBANCE INDEX was .7 /hour.  0 events occurred in REM sleep and 4 events in NREM. The REM AHI was 0 /hour, versus a non-REM AHI of .8. The patient spent 32 minutes of total sleep time in the supine position and 317 minutes in non-supine.. The supine AHI was 7.6 versus a non-supine AHI of 0.0.  OXYGEN SATURATION & C02:  The Wake baseline 02 saturation was 95%, with the lowest being 91%. Time spent below 89% saturation equaled 0 minutes.  PERIODIC LIMB MOVEMENTS:  The patient had a total of 0 Periodic Limb Movements.  The Periodic Limb Movement (PLM) index was 0 and the PLM Arousal index was 0/hour.  Post-study, the patient indicated that sleep was the same as usual.   IMPRESSION:  1.  Primary Snoring 2. Dysfunctions associated with sleep stages or arousal from sleep  RECOMMENDATIONS:  1. This study does not demonstrate any significant obstructive or central sleep disordered breathing with the exception of snoring and mild supine OSA. For this, CPAP therapy is not warranted; weight loss and avoidance of the supine sleep position are recommended. For disturbing snoring, an oral appliance (through a qualified dentist) can be considered.  2. This study shows sleep fragmentation and abnormal sleep stage  percentages; these are nonspecific findings and per se do not signify an intrinsic sleep disorder or a cause for the patient's sleep-related symptoms. Causes include (but are not limited to) the first night effect of the sleep study, circadian rhythm disturbances, medication effect or an underlying mood disorder or medical problem.  3. The patient should be cautioned not to drive, work at heights, or operate dangerous or heavy equipment when tired or sleepy. Review and reiteration of good sleep hygiene measures should be pursued with any patient. 4. The patient can follow-up with the referring provider, who will be notified of the test results.   I certify that I have reviewed the entire raw data recording prior to the issuance of this report in accordance with the Standards of Accreditation of the American Academy of Sleep Medicine (AASM)       [name], MD, PhD Diplomat, American Board of Psychiatry and Neurology (Neurology and Sleep Medicine)

## 2017-01-04 ENCOUNTER — Encounter: Payer: Self-pay | Admitting: Cardiology

## 2017-01-04 NOTE — Assessment & Plan Note (Signed)
On statin - last levels are within goal

## 2017-01-04 NOTE — Assessment & Plan Note (Signed)
From 2014 - no new event noted despite recent TIAs. Denies any palpitations to suggest Afib.  Plan TEE & 30 D event monitor

## 2017-01-04 NOTE — Assessment & Plan Note (Signed)
2 recent ~TIA events - still unclear etiology (pt seems to think that there was concern for Vert A. 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 I agree that embolic event is unlikely, however Neurology is requesting TEE &  Event Monitor.  I discussed R/B/A/I of TEE - discussed the procedure & potential risks. He agrees to proceed.  Contiue ASA & statin per Neuro recs.

## 2017-01-06 ENCOUNTER — Telehealth: Payer: Self-pay

## 2017-01-06 NOTE — Telephone Encounter (Signed)
-----   Message from Huston FoleySaima Athar, MD sent at 01/03/2017 11:02 AM EDT ----- Patient referred by CM/Dr. Roda ShuttersXu, seen by me on 12/26/16, diagnostic PSG on 01/01/17.   Please call and notify the patient that the recent sleep study did not show any significant obstructive sleep apnea with the exception of snoring and mild supine OSA (lowest O2 was 91%). For this, CPAP therapy is not warranted; weight loss and avoidance of the supine sleep position are recommended. For disturbing snoring, an oral appliance (through a qualified dentist) can be considered.  Please remind patient to try to maintain good sleep hygiene, which means: Keep a regular sleep and wake schedule and make enough time for sleep (7 1/2 to 8 1/2 hours for the average adult), try not to exercise or have a meal within 2 hours of your bedtime, try to keep your bedroom conducive for sleep, that is, cool and dark, without light distractors such as an illuminated alarm clock, and refrain from watching TV right before sleep or in the middle of the night and do not keep the TV or radio on during the night. If a nightlight is used, have it away from the visual field. Also, try not to use or play on electronic devices at bedtime, such as your cell phone, tablet PC or laptop. If you like to read at bedtime on an electronic device, try to dim the background light as much as possible. Do not eat in the middle of the night. Keep pets away from the bedroom environment. For stress relief, try meditation, deep breathing exercises (there are many books and CDs available), a white noise machine or fan can help to diffuse other noise distractors, such as traffic noise. Do not drink alcohol before bedtime, as it can disturb sleep and cause middle of the night awakenings. Never mix alcohol and sedating medications! Avoid narcotic pain medication close to bedtime, as opioids/narcotics can suppress breathing drive and breathing effort.    The patient can follow-up with the  referring provider, who will be notified of the test results. Once you have spoken to patient, you can close this encounter.   Thanks,  Huston FoleySaima Athar, MD, PhD [facility]

## 2017-01-06 NOTE — Telephone Encounter (Signed)
I called pt. I advised pt that Dr. Frances FurbishAthar reviewed pt's sleep study and found that pt did not have any significant sleep apnea with the exception of snoring and mild supine OSA. Dr. Frances FurbishAthar recommends that pt avoid supine sleep and consider a dental device to treat his snoring, if it is disturbing to him. I reviewed sleep hygiene recommendations with the pt, including trying to keep a regular sleep wake schedule, avoiding electronics in the bedroom, keeping the bedroom cool, dark, and quiet, and avoiding eating or exercising within 2 hours of bedtime as well as eating in the middle of the night. For stress relief, I encouraged meditation, deep breathing, etc. I advised him that a fan or a white noise machine can diffuse noises. I advised pt to avoid alcohol before bedtime, as it can disturb sleep and cause middle of the night awakenings. I advised him to never mix alcohol and sedating medications and to avoid narcotics close to bedtime due to suppressed breathing drive and effort. Pt that a copy of these sleep study results be sent to Dr. Edmon Crapeipton, PCP. Pt verbalized understanding of results. Pt had no questions at this time but was encouraged to call back if questions arise.

## 2017-01-06 NOTE — Telephone Encounter (Signed)
I called pt to discuss his sleep study results. No answer, left a message asking him to call me back. 

## 2017-01-06 NOTE — Telephone Encounter (Signed)
Pt called back re: sleep study results, asking to be called back please.

## 2017-01-09 ENCOUNTER — Encounter (HOSPITAL_COMMUNITY): Payer: Self-pay

## 2017-01-09 ENCOUNTER — Encounter (HOSPITAL_COMMUNITY)
Admission: RE | Disposition: A | Discharge status: Home / Self Care | Payer: Self-pay | Source: Ambulatory Visit | Attending: Cardiology

## 2017-01-09 ENCOUNTER — Ambulatory Visit (HOSPITAL_BASED_OUTPATIENT_CLINIC_OR_DEPARTMENT_OTHER): Payer: Commercial Managed Care - PPO

## 2017-01-09 ENCOUNTER — Ambulatory Visit (HOSPITAL_COMMUNITY)
Admission: RE | Admit: 2017-01-09 | Discharge: 2017-01-09 | Disposition: A | Discharge status: Home / Self Care | Payer: Commercial Managed Care - PPO | Source: Ambulatory Visit | Attending: Cardiology | Admitting: Cardiology

## 2017-01-09 ENCOUNTER — Telehealth: Payer: Self-pay | Admitting: Cardiology

## 2017-01-09 DIAGNOSIS — Z8673 Personal history of transient ischemic attack (TIA), and cerebral infarction without residual deficits: Secondary | ICD-10-CM | POA: Diagnosis not present

## 2017-01-09 DIAGNOSIS — Z7982 Long term (current) use of aspirin: Secondary | ICD-10-CM | POA: Insufficient documentation

## 2017-01-09 DIAGNOSIS — E785 Hyperlipidemia, unspecified: Secondary | ICD-10-CM | POA: Diagnosis not present

## 2017-01-09 DIAGNOSIS — Z01818 Encounter for other preprocedural examination: Secondary | ICD-10-CM

## 2017-01-09 DIAGNOSIS — G458 Other transient cerebral ischemic attacks and related syndromes: Secondary | ICD-10-CM

## 2017-01-09 DIAGNOSIS — I081 Rheumatic disorders of both mitral and tricuspid valves: Secondary | ICD-10-CM | POA: Insufficient documentation

## 2017-01-09 DIAGNOSIS — G459 Transient cerebral ischemic attack, unspecified: Secondary | ICD-10-CM | POA: Insufficient documentation

## 2017-01-09 DIAGNOSIS — I340 Nonrheumatic mitral (valve) insufficiency: Secondary | ICD-10-CM

## 2017-01-09 DIAGNOSIS — I6782 Cerebral ischemia: Secondary | ICD-10-CM | POA: Diagnosis present

## 2017-01-09 DIAGNOSIS — I639 Cerebral infarction, unspecified: Secondary | ICD-10-CM

## 2017-01-09 HISTORY — PX: TEE WITHOUT CARDIOVERSION: SHX5443

## 2017-01-09 SURGERY — ECHOCARDIOGRAM, TRANSESOPHAGEAL
Anesthesia: Moderate Sedation

## 2017-01-09 MED ORDER — DIPHENHYDRAMINE HCL 50 MG/ML IJ SOLN
INTRAMUSCULAR | Status: DC | PRN
  Administered 2017-01-09 (×2): 25 mg via INTRAVENOUS

## 2017-01-09 MED ORDER — DIPHENHYDRAMINE HCL 50 MG/ML IJ SOLN
INTRAMUSCULAR | Status: AC
  Filled 2017-01-09: qty 1

## 2017-01-09 MED ORDER — MIDAZOLAM HCL 5 MG/ML IJ SOLN
INTRAMUSCULAR | Status: AC
  Filled 2017-01-09: qty 2

## 2017-01-09 MED ORDER — BUTAMBEN-TETRACAINE-BENZOCAINE 2-2-14 % EX AERO
INHALATION_SPRAY | CUTANEOUS | Status: DC | PRN
  Administered 2017-01-09: 2 via TOPICAL

## 2017-01-09 MED ORDER — FENTANYL CITRATE (PF) 100 MCG/2ML IJ SOLN
INTRAMUSCULAR | Status: AC
  Filled 2017-01-09: qty 2

## 2017-01-09 MED ORDER — MIDAZOLAM HCL 10 MG/2ML IJ SOLN
INTRAMUSCULAR | Status: DC | PRN
  Administered 2017-01-09 (×3): 2 mg via INTRAVENOUS
  Administered 2017-01-09: 1 mg via INTRAVENOUS

## 2017-01-09 MED ORDER — SODIUM CHLORIDE 0.9 % IV SOLN
INTRAVENOUS | Status: DC
  Administered 2017-01-09: 500 mL via INTRAVENOUS

## 2017-01-09 MED ORDER — FENTANYL CITRATE (PF) 100 MCG/2ML IJ SOLN
INTRAMUSCULAR | Status: DC | PRN
  Administered 2017-01-09 (×2): 25 ug via INTRAVENOUS
  Administered 2017-01-09: 50 ug via INTRAVENOUS

## 2017-01-09 NOTE — Telephone Encounter (Signed)
New message    Pt wife verbalized that she is calling for rn to see if we can expedite the referral for Dr.Cooper   She wants the monitor done on the same day as Dr.Cooper

## 2017-01-09 NOTE — Discharge Instructions (Signed)

## 2017-01-09 NOTE — Interval H&P Note (Signed)
History and Physical Interval Note:  01/09/2017 8:25 AM  Cody Guerrero  has presented today for surgery, with the diagnosis of CEREBRAL ISCHEMIA  The various methods of treatment have been discussed with the patient and family. After consideration of risks, benefits and other options for treatment, the patient has consented to  Procedure(s): TRANSESOPHAGEAL ECHOCARDIOGRAM (TEE) (N/A) as a surgical intervention .  The patient's history has been reviewed, patient examined, no change in status, stable for surgery.  I have reviewed the patient's chart and labs.  Questions were answered to the patient's satisfaction.     Tobias AlexanderKatarina Renel Ende

## 2017-01-09 NOTE — Telephone Encounter (Signed)
Spoke w wife. Pt of Dr. Herbie BaltimoreHarding, had TEE today in hospital by Dr. Delton SeeNelson   She advised that a consult w Dr. Excell Seltzerooper to discuss PFO closure is recommended. I don't see consult order has dropped yet but wife is trying to make arrangement to see if event monitor could be done same day as consult, if coming up soon  - she is a nurse and notes she has to make arrangements to be off work for appts since patient isn't driving. Wanted to see if appts could be combined.  Aware once referral in our office will reach out to schedule. Will route to Lauren for any additional recommendations.

## 2017-01-09 NOTE — CV Procedure (Addendum)
     Transesophageal Echocardiogram Note  Cody BrackettJamie Guerrero [id number] 09/17/1975  Procedure: Transesophageal Echocardiogram Indications: Recurrent stroke  Procedure Details Consent: Obtained Time Out: Verified patient identification, verified procedure, site/side was marked, verified correct patient position, special equipment/implants available, Radiology Safety Procedures followed,  medications/allergies/relevent history reviewed, required imaging and test results available.  Performed  Medications: During this procedure the patient is administered a total of Versed 7 mg mg, Benadryl 50 mg and Fentanyl 100 mcg to achieve and maintain moderate conscious sedation.  The patient's heart rate, blood pressure, and oxygen saturation are monitored continuously during the procedure. The period of conscious sedation is 40 minutes, of which I was present face-to-face 100% of this time.  - Left ventricle: The cavity size was mildly dilated. Wall   thickness was normal. Systolic function was normal. The estimated   ejection fraction was in the range of 55% to 60%. Wall motion was   normal; there were no regional wall motion abnormalities. Doppler   parameters are consistent with abnormal left ventricular   relaxation (grade 1 diastolic dysfunction).  Impressions:  - Normal LV systolic function; mild LVE; probable mild diastolic   dysfunction; trace MR and TR.  Left Ventrical:  LVEF 55-60%  Mitral Valve: Trivial MR  Aortic Valve: No AI  Tricuspid Valve: Mild TR.  Pulmonic Valve: No PR.  Left Atrium/ Left atrial appendage: No thrombus in LAA, normal filling and emptying velocities.  Atrial septum: Positive PFO by color Doppler and bubble study.   Aorta: No plaque.   A consult with Dr Excell Seltzerooper for PFO closure consideration is recommended.  Complications: No apparent complications Patient did tolerate procedure well.  Cody AlexanderKatarina Kien Mirsky, MD, [facility] 01/09/2017, 8:26 AM

## 2017-01-09 NOTE — Progress Notes (Signed)
  Echocardiogram Echocardiogram Transesophageal has been performed.  Tye SavoyCasey N Brenae Lasecki 01/09/2017, 10:04 AM

## 2017-01-09 NOTE — H&P (View-Only) (Signed)
PCP: Juanita Lasteripton, [name], MD  Clinic Note: Chief Complaint  Patient presents with  . New Evaluation    pt states he has had 2 recent TIA/ strokes - here for TEE work-up    HPI: Cody Guerrero is a 41 y.o. male who is being seen today for post-stroke evaluation (pre-TEE) at the request of Juanita Lasteripton, [name], MD & Dr. Roda ShuttersXu  [facility] Sleep study last PM.   Cody Guerrero was last seen on December 31, 2013 by his PCP for f/u after CVA/TIA events. (notes indicate L Vert A stenosis (but CTA notes normal Vert A, occluded L PCA-P3 segment)  Recent Hospitalizations:  12/06/2016: TIA - was @ work & felt sudden onset dizziness & L-side body tingling/weakness, sweating (wife described him as being diaphoretic), noted L Eye vision disturbance with HA & dropping his cell phone--> [facility] --> [place] --> Heparin & Plavix  (Sx lasted ~30 min & "resolved") - MRI did not show acute CVA.  Also ? June 9 - ? TIA (did not go to ER) - noted dizziness & blurred vision with tinnitus - Sx lasted ~3 hr & spontaneously resolved.  Studies Personally Reviewed - (if available, images/films reviewed: From Epic Chart or Care Everywhere)  2-D Echo on 12/07/2016: Normal LV size and function. EF 55-60%. Probable mild diastolic dysfunction. Otherwise trace MR and TR.  CT Angio Head-Neck 12/06/2016: normal Ao Arch -> normal origins of Innominate, L CCA (RCCA) & LSCA. Bilat ICA patent. R Vert Dominant. Patent communicating As (Ant & Mid Cerebral As). 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 No acute finding.  Interval History: Cody Guerrero presents today with no notable CV complaints beyond his recent CVA/TIA events.  Not really having much in the way of residual Sx.Still has some baseline dizziness - poor balance. Not able to work b/c requires climbing ladders & lots of physical activity. He has not been really  active, but had been relatively asymptomatic prior to his events. Usually exercises 4-5 days / week ~60 min per day using Eliptical, weights & gym exercises. (not since last events). Usually walks ~12+ miles /day @ work Notes dizziness with sudden head movements No chest pain or shortness of breath with rest or exertion. No PND, orthopnea or edema. No palpitations, or syncope/near syncope. No TIA/amaurosis fugax symptoms since June 9 episode.  No claudication.  Told to avoid abrupt head turning.  Allow ~permissive HTN (felt that 6/9 episode was related to relative hypotension) Neurology has recommended TEE.  ROS: A comprehensive was performed. Review of Systems  Constitutional: Negative for malaise/fatigue.  HENT: Negative for nosebleeds.   Respiratory: Negative for cough, shortness of breath and wheezing.   Gastrointestinal: Negative for blood in stool and melena.  Genitourinary: Negative for hematuria.  Musculoskeletal: Negative for falls and joint pain.  Neurological: Positive for dizziness and focal weakness (none since 6/9).       Besides some dizziness - no acute Sx since 6/9  Psychiatric/Behavioral: Negative for memory loss.  All other systems reviewed and are negative.  I have reviewed and (if needed) personally updated the patient's problem list, medications, allergies, past medical and surgical history, social and family history.   Past Medical History:  Diagnosis Date  . Family history of adverse reaction to anesthesia    MOTHER  . Hyperlipemia   . 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  Past Surgical History:  Procedure Laterality Date  . HERNIA REPAIR  08/2015    Current Meds  Medication Sig  . aspirin 325 MG EC tablet Take 325 mg by mouth daily.  Marland Kitchen  atorvastatin (LIPITOR) 40 MG tablet Take 40 mg by mouth daily.  Marland Kitchen b complex vitamins tablet Take 1 tablet by mouth daily.  . meclizine (ANTIVERT) 25 MG tablet Take 12.5 mg by mouth 3 (three) times daily as needed for dizziness.   . Omega-3 Fatty Acids (FISH OIL) 1000 MG CAPS Take 2 capsules by mouth daily.     No Known Allergies  Social History   Social History  . Marital status: Married    Spouse name: N/A  . Number of children: N/A  . Years of education: N/A   Social History Main Topics  . Smoking status: Never Smoker  . Smokeless tobacco: Never Used  . Alcohol use No  . Drug use: No  . Sexual activity: Not Asked   Other Topics Concern  . None   Social History Narrative   Lives at home w/ his wife and 3 kids   Right-handed   Caffeine: rare       family history includes Breast cancer in his mother; Cancer in his maternal grandfather, maternal grandmother, and paternal grandfather; Hyperlipidemia in his father and mother.  Wt Readings from Last 3 Encounters:  01/02/17 257 lb (116.6 kg)  12/26/16 252 lb (114.3 kg)  12/24/16 253 lb (114.8 kg)    PHYSICAL EXAM BP 115/79 (BP Location: Left Arm, Patient Position: Sitting, Cuff Size: Large)   Pulse 83   Ht 6\' 1"  (1.854 m)   Wt 257 lb (116.6 kg)   BMI 33.91 kg/m  General appearance: alert, cooperative, appears stated age, no distress. Mild-moderately obese; well-nourished & well-groomed. HEENT: [place]/AT, EOMI, MMM, anicteric sclera Neck: no adenopathy, no carotid bruit and no JVD Lungs: clear to auscultation bilaterally, normal percussion bilaterally and non-labored Heart: regular rate and rhythm, S1 &S2 normal, no murmur, click, rub or gallop; non-displaced PMI Abdomen: soft, non-tender; bowel sounds normal; no masses,  no organomegaly; no HJR. Mild truncal obesity. Extremities: extremities normal, atraumatic, no cyanosis, or edema Pulses: 2+ and symmetric;  Skin: mobility and turgor normal, no evidence of bleeding or  bruising, no lesions noted and texture normal  Neurologic: Mental status: Alert & oriented x 3, thought content appropriate; non-focal exam.  Pleasant mood & affect. Cranial nerves: normal (II-XII grossly intact) - no obvious neurologic deficit except for ~ mild L sided diminshed tactile sense.    Adult ECG Report n/a  Other studies Reviewed: Additional studies/ records that were reviewed today include:  Recent Labs:   Lab Results  Component Value Date   CREATININE 0.88 12/06/2016   BUN 14 12/06/2016   NA 140 12/06/2016   K 3.6 12/06/2016   CL 105 12/06/2016   CO2 26 12/06/2016   Lab Results  Component Value Date   CHOL 130 12/07/2016   HDL 43 12/07/2016   LDLCALC 67 12/07/2016   TRIG 98 12/07/2016   CHOLHDL 3.0 12/07/2016    ASSESSMENT / PLAN: Problem List Items Addressed This Visit    Cerebellar infarct (HCC)    From 2014 - no  new event noted despite recent TIAs. Denies any palpitations to suggest Afib.  Plan TEE & 30 D event monitor      Relevant Orders   Cardiac event monitor   ECHO TEE   Hyperlipidemia    On statin - last levels are within goal      Relevant Orders   Cardiac event monitor   ECHO TEE   Transient cerebral ischemia - Primary    2 recent ~TIA events - still unclear etiology (pt seems to think that there was concern for Vert A. 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 I agree that embolic event is unlikely, however Neurology is requesting TEE &  Event Monitor.  I discussed R/B/A/I of TEE - discussed the procedure & potential risks. He agrees to proceed.  Contiue ASA & statin per Neuro recs.      Relevant Orders   Cardiac event monitor   ECHO TEE    Other Visit Diagnoses    Pre-op testing          Current medicines are reviewed at length with the patient today. (+/- concerns) n/a The following changes have been made: n/a  Patient Instructions    SCHEDULE AT [facility] Your physician has requested that you have an  TRANSESOPHAGEAL echocardiogram  (TEE). Echocardiography is a painless test that uses sound waves to create images of your heart. It provides your doctor with information about the size and shape of your heart and how well your heart's chambers and valves are working. This procedure takes approximately one hour. There are no restrictions for this procedure.     SCHEDULE [address] Your physician has recommended that you wear an event monitor 30 DAY. Event monitors are medical devices that record the heart's electrical activity. Doctors most often [place] these monitors to diagnose arrhythmias. Arrhythmias are problems with the speed or rhythm of the heartbeat. The monitor is a small, portable device. You can wear one while you do your normal daily activities. This is usually used to diagnose what is causing palpitations/syncope (passing out).   NO CHANGE WITH MEDICATIONS    Your physician recommends that you schedule a follow-up appointment in 2 MONTHS WITH DR HARDING.      Studies Ordered:   Orders Placed This Encounter  Procedures  . Cardiac event monitor  . ECHO TEE      Bryan Lemma, M.D., M.S. Interventional Cardiologist   Pager # [phone] Phone # [phone] [address] [place], [place] [address]

## 2017-01-12 ENCOUNTER — Encounter (HOSPITAL_COMMUNITY): Payer: Self-pay | Admitting: Cardiology

## 2017-01-13 ENCOUNTER — Ambulatory Visit (INDEPENDENT_AMBULATORY_CARE_PROVIDER_SITE_OTHER): Payer: Commercial Managed Care - PPO

## 2017-01-13 ENCOUNTER — Other Ambulatory Visit: Payer: Self-pay | Admitting: Nurse Practitioner

## 2017-01-13 DIAGNOSIS — G450 Vertebro-basilar artery syndrome: Secondary | ICD-10-CM

## 2017-01-13 DIAGNOSIS — G458 Other transient cerebral ischemic attacks and related syndromes: Secondary | ICD-10-CM

## 2017-01-13 DIAGNOSIS — I639 Cerebral infarction, unspecified: Secondary | ICD-10-CM

## 2017-01-13 DIAGNOSIS — I4891 Unspecified atrial fibrillation: Secondary | ICD-10-CM

## 2017-01-13 NOTE — Telephone Encounter (Signed)
Pt scheduled for PFO consult with Dr Excell Seltzerooper on 01/23/17.

## 2017-01-20 ENCOUNTER — Institutional Professional Consult (permissible substitution): Payer: Commercial Managed Care - PPO | Admitting: Neurology

## 2017-01-21 ENCOUNTER — Telehealth: Payer: Self-pay

## 2017-01-21 ENCOUNTER — Telehealth: Payer: Self-pay | Admitting: Neurology

## 2017-01-21 NOTE — Telephone Encounter (Signed)
I am totally fine with that. Please schedule pt with Dr. Pearlean BrownieSethi if he agrees.  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 [facility] and found to have HLD and carotid mild stenosis. Since then he had recurrent similar episodes of dizziness, left UE tingling, HA, blurry vision, wavy lines. This is not consistent with cardioembolic stroke. MRI so far negative. I would not recommend TEE. But anyway, he had TEE and found to have PFO, but I would not relate the PFO to his recurrent events. This is just my opinion. He will see Dr. Pearlean BrownieSethi and Dr. Excell Seltzerooper for evaluation. Thanks.   Marvel PlanJindong Arleatha Philipps, MD PhD Stroke Neurology 01/21/2017 1:26 PM

## 2017-01-21 NOTE — Telephone Encounter (Signed)
Patient is changing care from Dr. Roda ShuttersXu to Dr. Pearlean BrownieSethi. Pt request this change, and both doctors have responded. Dr .Pearlean BrownieSethi stated he will see patient. Pt has been schedule with Dr.Sethi. Appts have been cancel with Dr Roda ShuttersXu.

## 2017-01-21 NOTE — Telephone Encounter (Signed)
Patient has appts with Dr Roda ShuttersXu in 02/2017 and 04/2017 already schedule. Message was sent to Dr. Roda ShuttersXu, and Dr. Pearlean BrownieSethi for review.

## 2017-01-21 NOTE — Telephone Encounter (Signed)
Ok I will see him

## 2017-01-21 NOTE — Telephone Encounter (Addendum)
Patient calling to switch doctors from Dr. Roda ShuttersXu to Dr. Pearlean BrownieSethi.  Patient says he asked Dr. Roda ShuttersXu at last visit with him to order a TEE but Dr. Roda ShuttersXu said that was not needed. Patient had a TIA 12-21-16 and saw Eber JonesCarolyn on 12-24-16 for this and she ordered TEE which showed he had a hole in his heart. He says he had rather see Dr. Pearlean BrownieSethi.

## 2017-01-23 ENCOUNTER — Ambulatory Visit (INDEPENDENT_AMBULATORY_CARE_PROVIDER_SITE_OTHER): Payer: Commercial Managed Care - PPO | Admitting: Cardiovascular Disease

## 2017-01-23 ENCOUNTER — Encounter: Payer: Self-pay | Admitting: Cardiovascular Disease

## 2017-01-23 VITALS — BP 100/68 | HR 76 | Ht 73.0 in | Wt 258.0 lb

## 2017-01-23 DIAGNOSIS — Q211 Atrial septal defect: Secondary | ICD-10-CM | POA: Diagnosis not present

## 2017-01-23 DIAGNOSIS — Q2112 Patent foramen ovale: Secondary | ICD-10-CM

## 2017-01-23 NOTE — Progress Notes (Signed)
Cardiology Office Note Date:  01/23/2017   ID:  Cody Guerrero, DOB 10/27/1975, MRN [id number]  PCP:  Cody Guerrero  Cardiologist:  Cody Lemmaavid Harding, Guerrero  Chief Complaint  Patient presents with  . PFO Evaluation   History of Present Illness: Cody Guerrero is a 41 y.o. male who presents for evaluation of PFO closure.   The patient is here with his wife today.  The patient initially presented with cerebellar stroke in 2014. At that time an MRA apparently showed distal left vertebral artery stenosis. He had no issues in the interim until May of this year when he presented with dizziness and gait instability. He also had tingling and weakness in the left hand. An MRI showed no acute findings. He's had another episode of dizziness since that time and was evaluated in the neurology office. A cardiac event monitor and a TEE were recommended. The TEE shows a PFO and the patient is referred for discussion of transcatheter PFO closure.  The patient has no cardiac-related symptoms. He specifically denies chest pain, chest pressure, shortness of breath, or heart palpitations. He denies leg swelling, orthopnea, or PND. He has not been able to return to work yet because of persistent dizziness.   Past Medical History:  Diagnosis Date  . Family history of adverse reaction to anesthesia    MOTHER  . Hyperlipemia   . Stroke [facility](HCC)     Past Surgical History:  Procedure Laterality Date  . HERNIA REPAIR  08/2015  . TEE WITHOUT CARDIOVERSION N/A 01/09/2017   Procedure: TRANSESOPHAGEAL ECHOCARDIOGRAM (TEE);  Surgeon: Lars MassonNelson, Katarina H, Guerrero;  [facility];  Service: Cardiovascular;  Laterality: N/A;    Current Outpatient Prescriptions  Medication Sig Dispense Refill  . aspirin 325 MG EC tablet Take 325 mg by mouth daily.    Marland Kitchen. atorvastatin (LIPITOR) 40 MG tablet Take 40 mg by mouth daily.    Marland Kitchen. b complex vitamins tablet Take 1 tablet by mouth daily.    . meclizine (ANTIVERT) 25 MG tablet Take  12.5 mg by mouth 3 (three) times daily as needed for dizziness.     . Omega-3 Fatty Acids (FISH OIL) 1000 MG CAPS Take 2 capsules by mouth daily.     . psyllium (METAMUCIL) 58.6 % powder Take 1 packet by mouth daily as needed.     No current facility-administered medications for this visit.     Allergies:   Patient has no known allergies.   Social History:  The patient  reports that he has never smoked. He has never used smokeless tobacco. He reports that he does not drink alcohol or use drugs.   Family History:  The patient's  family history includes Breast cancer in his mother; Cancer in his maternal grandfather, maternal grandmother, and paternal grandfather; Hyperlipidemia in his father and mother.    ROS:  Please see the history of present illness.  Otherwise, review of systems is positive for snoring, headaches, balance problems. All other systems are reviewed and negative.    PHYSICAL EXAM: VS:  BP 100/68   Pulse 76   Ht 6\' 1"  (1.854 m)   Wt 258 lb (117 kg)   SpO2 98%   BMI 34.04 kg/m  , BMI Body mass index is 34.04 kg/m. GEN: Well nourished, well developed, in no acute distress  HEENT: normal  Neck: no JVD, no masses. No carotid bruits Cardiac: RRR without murmur or gallop  Respiratory:  clear to auscultation bilaterally, normal work of breathing GI: soft, nontender, nondistended, + BS MS: no deformity or atrophy  Ext: no pretibial edema, pedal pulses 2+= bilaterally Skin: warm and dry, no rash Neuro:  Strength and sensation are intact Psych: euthymic mood, full affect  EKG:  EKG is not ordered today.  Recent Labs: 12/06/2016: ALT 42; BUN 14; Creatinine, Ser 0.88; Potassium 3.6; Sodium 140 12/08/2016: Hemoglobin 15.0; Platelets 168   Lipid Panel     Component Value Date/Time   CHOL 130 12/07/2016 0347   TRIG 98 12/07/2016 0347   HDL 43 12/07/2016 0347   CHOLHDL 3.0 12/07/2016 0347   VLDL 20 12/07/2016 0347   LDLCALC 67 12/07/2016 0347       Wt Readings from Last 3 Encounters:  01/23/17 258 lb (117 kg)  01/02/17 257 lb (116.6 kg)  12/26/16 252 lb (114.3 kg)     Cardiac Studies Reviewed: 2D Echo: Study Conclusions  - Left ventricle: The cavity size was mildly dilated. Wall   thickness was normal. Systolic function was normal. The estimated   ejection fraction was in the range of 55% to 60%. Wall motion was   normal; there were no regional wall motion abnormalities. Doppler   parameters are consistent with abnormal left ventricular   relaxation (grade 1 diastolic dysfunction).  Impressions:  - Normal LV systolic function; mild LVE; probable mild diastolic   dysfunction; trace MR and TR.  TEE: Study Conclusions  - Left ventricle: Systolic function was normal. The estimated   ejection fraction was in the range of 55% to 60%. Wall motion was   normal; there were no regional wall motion abnormalities. - Aortic valve: No evidence of vegetation. - Mitral valve: There was mild regurgitation. - Left atrium: No evidence of thrombus in the atrial cavity or   appendage. No evidence of thrombus in the atrial cavity or   appendage. - Right atrium: No evidence of thrombus in the atrial cavity or   appendage. - Atrial septum: Prominent crista terminalis. There was a patent   foramen ovale. - Tricuspid valve: There was trivial regurgitation. - Pulmonic valve: No evidence of vegetation.  Impressions:  - There is present PFO by color Doppler and bubble study.   A consult to Dr Excell Seltzer for PFO closure consideration is   recommended.  CT Angio Head 12/06/2016: IMPRESSION: CTA neck:  Negative.  Moderate C6-7 canal stenosis.  CTA HEAD: RIGHT P3 occlusion, potentially acute given patient's symptoms.  Mild intracranial atherosclerosis, moderate RIGHT PCA involvement.  MRI Brain: IMPRESSION: No acute posterior circulation abnormality is evident. There is a chronic infarct of the LEFT superior  cerebellum.  No proximal vascular occlusion.  No acute intracranial findings. Mild subcortical white matter disease, likely chronic microvascular ischemic change.  ASSESSMENT AND PLAN: PFO: The patient's TEE is reviewed. He has no valvular all in the in his LV function is normal. He does have a PFO with spontaneous left to right color flow through the defect as well as right to left flow demonstrated on a bubble study. There is a prominent eustachian valve which can direct flow across the PFO. Anatomically the PFO is suitable for transcatheter closure. I reviewed the procedure at length with the patient today. We discussed specific risks, indications, and alternatives. We discussed randomized controlled trial data comparing transcatheter closure to medical therapy. The patient and his wife have a good understanding of all of this. The major issue is whether the PFO is a "innocent bystander"  or potentially representative of the etiology of his stroke. I explained to the patient that I depend on neurology evaluation to guide which patients will benefit from PFO closure and there is evidence that the PFO may not have been a causative factor in his presentation.  He will go for another neurologic opinion from Dr. Pearlean Brownie. If it is felt that transcatheter PFO closure might help reduce the patient's risk of recurrent TIA then I would be happy to schedule the procedure, otherwise I would recommend following neurology recommendations for ongoing medical therapy. The patient and his wife understand and all of their questions are answered today.  Current medicines are reviewed with the patient today.  The patient does not have concerns regarding medicines.  Labs/ tests ordered today include:  No orders of the defined types were placed in this encounter.  Disposition:  Keep appt with Dr Pearlean Brownie. Further follow-up pending his evaluation.   Enzo Bi, Guerrero  01/23/2017 5:47 PM    [facility] [address] [place], [place], [place]  [address] Phone: [phone]; Fax: [phone]

## 2017-01-23 NOTE — Patient Instructions (Signed)
Medication Instructions:  Your physician recommends that you continue on your current medications as directed. Please refer to the Current Medication list given to you today.  Labwork: No new orders.   Testing/Procedures: No new orders.   Follow-Up: Please follow-up with Dr Pearlean BrownieSethi as scheduled.  Your physician recommends that you schedule a follow-up appointment as needed with Dr Excell Seltzerooper.    Any Other Special Instructions Will Be Listed Below (If Applicable).     If you need a refill on your cardiac medications before your next appointment, please call your pharmacy.

## 2017-01-29 ENCOUNTER — Ambulatory Visit (INDEPENDENT_AMBULATORY_CARE_PROVIDER_SITE_OTHER): Payer: Commercial Managed Care - PPO | Admitting: Neurology

## 2017-01-29 ENCOUNTER — Encounter: Payer: Self-pay | Admitting: Neurology

## 2017-01-29 VITALS — BP 121/79 | HR 69 | Wt 262.4 lb

## 2017-01-29 DIAGNOSIS — Q211 Atrial septal defect: Secondary | ICD-10-CM | POA: Diagnosis not present

## 2017-01-29 DIAGNOSIS — Q2112 Patent foramen ovale: Secondary | ICD-10-CM

## 2017-01-29 MED ORDER — CLOPIDOGREL BISULFATE 75 MG PO TABS: 75.0000 mg | ORAL_TABLET | Freq: Every day | ORAL | 11 refills | Status: DC

## 2017-01-29 NOTE — Progress Notes (Signed)
[facility] [address] [place]. [place] [address] [phone]       OFFICE FOLLOW-UP NOTE  [name] Date of Birth:  [birth year] Medical Record Number:  [id number]   HPI: [name] is a 40 year Caucasian male seen today for second opinion for TIA and PFO closure upon his request. History is obtained from the patient, his wife, review of electronic medical records and have personally reviewed imaging films and hospital workup. I also reviewed consultation notes from Dr. Tonny Bollman, previous neurological notes from Dr. Chriss Czar, Dr. Roda Shutters as well as Dr. [name]. 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 He was started on aspirin 325 mg daily as well as treatment for hyperlipidemia and stated he did well over the years until a few months ago. He did have some intermittent transient dizziness when he turned his neck quickly but this was not bothersome until 2 months ago when he had an episode at work when he developed sudden onset of dizziness he describes this as a feeling of swimmy headedness and not true vertigo. He sat for a few minutes but symptoms did not resolve. He also noted some tingling in his left hand and he dropped his cell phone and started sweating. He complained of a headache and some blurred vision in his eyes. When seen in the emergency room his blood pressure was found to be low at 110/80. Symptoms resolved within 2 hours he did undergo MRI scan of the brain on 12/07/16 which have personally reviewed showed a small old left cerebral infarct but no acute finding. There are mild changes of small vessel disease. CT angiogram of the brain showed no significant extracranial stenosis. The terminal left vertebral artery appear to be open. The right posterior cerebral artery had tandem occlusion in the distal P3 segment. Patient states his  also had some other episodes in which she is feels dizzy and swimmy headed as well as some ringing in the years but last 4-5 minutes. There currently variable frequency sometimes once a month 2 other 3-4 times a week week. This lasts no more than a few minutes and occur always while he is at work. He also states he had an episode in September last year when his blood pressure was significantly elevated when he had similar feeling of dizziness and head swimming. He had outpatient TEE performed on 01/09/17 which showed a patent foramen ovale but no atrial septal aneurysm or thrombus. The size of the PFO is not mentioned on the report. The patient was seen by Dr. 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 The patient saw Dr. Excell Seltzer for possible endovascular PFO closure but is referred to me for my opinion about this. The patient does inform me that he has a very physically and emotionally taxing job. He works 10-12 hours there and has to attend several phone calls and do a lot of troubleshooting. Is not been able to walk since this episode. He underwent polysomnogram but was not found to have significant obstructive sleep apnea. He saw Dr. Clyda Hurdle for the same ROS:   14 system review of systems is positive for  activity change, fatigue, ringing in the ears, dizziness, headache, decreased concentration, neck stiffness, snoring and all other systems negative  PMH:  Past Medical History:  Diagnosis Date  . Family history of adverse reaction to anesthesia    MOTHER  . Hyperlipemia   . Stroke [facility])     Social History:  Social History   Social History  . Marital status: Married    Spouse name: N/A  . Number of children: N/A  . Years of education: N/A   Occupational History  . Not on file.   Social History Main Topics  . Smoking status: Never Smoker  . Smokeless tobacco: Never Used  .  Alcohol use No  . Drug use: No  . Sexual activity: Not on file   Other Topics Concern  . Not on file   Social History Narrative   Lives at home w/ his wife and 3 kids   Right-handed   Caffeine: rare       Medications:   Current Outpatient Prescriptions on File Prior to Visit  Medication Sig Dispense Refill  . aspirin 325 MG EC tablet Take 325 mg by mouth daily.    Marland Kitchen atorvastatin (LIPITOR) 40 MG tablet Take 40 mg by mouth daily.    Marland Kitchen b complex vitamins tablet Take 1 tablet by mouth daily.    . Omega-3 Fatty Acids (FISH OIL) 1000 MG CAPS Take 2 capsules by mouth daily.     . psyllium (METAMUCIL) 58.6 % powder Take 1 packet by mouth daily as needed.     No current facility-administered medications on file prior to visit.     Allergies:  No Known Allergies  Physical Exam General: well developed, well nourished Middle-age Caucasian male, seated, in no evident distress Head: head normocephalic and atraumatic.  Neck: supple with no carotid or supraclavicular bruits Cardiovascular: regular rate and rhythm, no murmurs Musculoskeletal: no deformity Skin:  no rash/petichiae Vascular:  Normal pulses all extremities Vitals:   01/29/17 0956  BP: 121/79  Pulse: 69   Neurologic Exam Mental Status: Awake and fully alert. Oriented to place and time. Recent and remote memory intact. Attention span, concentration and fund of knowledge appropriate. Mood and affect appropriate.  Cranial Nerves: Fundoscopic exam reveals sharp disc margins. Pupils equal, briskly reactive to light. Extraocular movements full without nystagmus. Visual fields full to confrontation. Hearing intact. Facial sensation intact. Face, tongue, palate moves normally and symmetrically.  Motor: Normal bulk and tone. Normal strength in all tested extremity muscles. Sensory.: intact to touch ,pinprick .position and vibratory sensation.  Coordination: Rapid alternating movements normal in all extremities. Finger-to-nose and  heel-to-shin performed accurately bilaterally. Gait and Station: Arises from chair without difficulty. Stance is normal. Gait demonstrates normal stride length and balance . Able to heel, toe and tandem walk without difficulty.  Reflexes: 1+ and symmetric. Toes downgoing.   NIHSS  0 Modified Rankin  0   ASSESSMENT: 41 year old Caucasian male with left cerebellar infarct in 2014 felt to be related to intracranial atherosclerotic disease with subsequent episodes of transient dizziness and gait instability and occasional left-sided paresthesias and weakness with negative imaging studies. It is questionable wether these  represent vertebrobasilar TIAs versus atypical migraine or stress related events. Patient has been found to have a  patent foramen ovale on transesophageal echocardiogram but its relationship to these episodes is not definite. He is currently wearing a 30 day heart monitor to look for paroxysmal A. fib    PLAN: I had a long d/w patient and his wife about his remote stroke, PFO and recent episodes , old stroke, risk for recurrent stroke/TIAs, personally independently  reviewed imaging studies and stroke evaluation results and answered questions.Recommend dual antiplatelet therapy with aspirin 325 mg daily and clopidogrel 75 mg daily   For 1 month and then clopidogrel alone for secondary stroke prevention and maintain strict control of hypertension with blood pressure goal below 130/90, diabetes with hemoglobin A1c goal below 6.5% and lipids with LDL cholesterol goal below 70 mg/dL. I also advised the patient to eat a healthy diet with plenty of whole grains, cereals, fruits and vegetables, exercise regularly and maintain ideal body weight . I explained to the patient and his wife that the patient's stroke was in the remote past 3 years ago and current trials which showed benefit of endovascular PFO closure were done when patients were randomized within 6-9 months after the index stroke. I  would recommend aggressive medical therapy first. If he continues to have recurrent TIAs particularly in different vascular distributions are another stroke would recommend PFO closure at that time. Check transcranial Doppler bubble study to further categorize a PFO. I also encouraged the patient to increase participation in stress relaxation activities like regular exercise, meditation and yoga Followup in the future with me in future as needed only Greater than 50% of time during this prolonged 50 minute visit was spent on counseling,explanation of diagnosis stroke, TIA, relationship with PFO, discussion of treatment options, planning of further management, discussion with patient and family and coordination of care Delia HeadyPramod Jaylinn Hellenbrand, MD  [facility] [address] [place], [place] [id number]  Phone [phone] Fax [phone] Note: This document was prepared with digital dictation and possible smart phrase technology. Any transcriptional errors that result from this process are unintentional

## 2017-01-29 NOTE — Patient Instructions (Signed)
I had a long d/w patient and his wife about his remote stroke, PFO and recent episodes stroke, risk for recurrent stroke/TIAs, personally independently reviewed imaging studies and stroke evaluation results and answered questions.Recommend dual antiplatelet therapy with aspirin 325 mg daily and clopidogrel 75 mg daily   For 1 month and then clopidogrel alone for secondary stroke prevention and maintain strict control of hypertension with blood pressure goal below 130/90, diabetes with hemoglobin A1c goal below 6.5% and lipids with LDL cholesterol goal below 70 mg/dL. I also advised the patient to eat a healthy diet with plenty of whole grains, cereals, fruits and vegetables, exercise regularly and maintain ideal body weight . I explained to the patient and his wife that the patient's stroke was in the remote past 3 years ago and current trials which showed benefit of endovascular PFO closure were done when patients were randomized within 6-9 months after the index stroke. I would recommend aggressive medical therapy first. If he continues to have recurrent TIAs particularly in different vascular distributions are another stroke would recommend PFO closure at that time. Check transcranial Doppler bubble study to further categorize a PFO. I also encouraged the patient to increase participation in stress relaxation activities like regular exercise, meditation and yoga Followup in the future with me in future as needed only

## 2017-02-05 ENCOUNTER — Telehealth: Payer: Self-pay | Admitting: Neurology

## 2017-02-05 NOTE — Telephone Encounter (Signed)
Revised. 

## 2017-02-05 NOTE — Telephone Encounter (Signed)
Rn call Dr. Pearlean BrownieSethi via telephone about pt wanting a letter to be out of work. Per Dr. Pearlean BrownieSethi pt has a bubble study on 02/19/2017 at [facility]. Dr. Pearlean BrownieSethi stated he will see patient during the bubble study procedure to discuss work options after test.. Per D. Pearlean BrownieSethi pt will need to call his PCP who has being out of work longer. The PCP iis the one that has been excusing patient from work since May 2018. Pt saw PCP 01/24/2017 and they discuss part time work options per the note. RN will call patient.

## 2017-02-05 NOTE — Telephone Encounter (Signed)
Note in care everywhere that pt saw PCP on 01/24/2017, and they discuss going back to work part time. Pt would like a call at [phone].

## 2017-02-05 NOTE — Telephone Encounter (Signed)
RN call patient about needing a note to be out of work. Rn stated he change doctors from Dr. Roda ShuttersXu to Dr. Pearlean BrownieSethi. Rn also our MD, or NP never put him out of work. Pt stated his PCP wrote a letter, and excuse him out work since Dec 06, 2016. Rn stated per Dr.Sethi it does not say can be out of work. Pt stated he cannot return to work because he climbs letters sometimes with his job. Rn stated Dr Pearlean BrownieSethi is out of the office till Monday. Rn stated a message will be sent to Dr.Sethi.

## 2017-02-05 NOTE — Telephone Encounter (Signed)
Note revised

## 2017-02-05 NOTE — Telephone Encounter (Signed)
Patient calling to discuss getting a note for being out of work.

## 2017-02-05 NOTE — Telephone Encounter (Addendum)
Rn call patient back about Dr. Marlis EdelsonSethi;s recommendations for the letter. Rn stated Dr.Sethi only saw him one time for a second opinion. Rn stated per Dr. Pearlean BrownieSethi he will see patient on 02/19/2017 for the TCD bubble study at [facility], and work can be discuss based on bubble study,and if his dizzy spells have improve.. Pt also has open PFO that has been evaluated by Dr.Cooper. Pt just wants a overall plan after 02/19/2017 from Dr Pearlean BrownieSethi.. Rn stated per Dr. Pearlean BrownieSethi he should contact his PCP about his work status. Rn stated per his PCP last office note on 01/24/2017 he recommend working  part time if he can. 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

## 2017-02-12 ENCOUNTER — Ambulatory Visit: Payer: Commercial Managed Care - PPO | Admitting: Neurology

## 2017-02-18 ENCOUNTER — Ambulatory Visit: Payer: Commercial Managed Care - PPO | Admitting: Neurology

## 2017-02-19 ENCOUNTER — Telehealth: Payer: Self-pay | Admitting: Nurse Practitioner

## 2017-02-19 ENCOUNTER — Encounter: Payer: Self-pay | Admitting: Nurse Practitioner

## 2017-02-19 ENCOUNTER — Ambulatory Visit (HOSPITAL_COMMUNITY)
Admission: RE | Admit: 2017-02-19 | Discharge: 2017-02-19 | Disposition: A | Discharge status: Home / Self Care | Payer: Commercial Managed Care - PPO | Source: Ambulatory Visit | Attending: Neurology | Admitting: Neurology

## 2017-02-19 DIAGNOSIS — Q211 Atrial septal defect: Secondary | ICD-10-CM

## 2017-02-19 DIAGNOSIS — Q2112 Patent foramen ovale: Secondary | ICD-10-CM

## 2017-02-19 DIAGNOSIS — R9439 Abnormal result of other cardiovascular function study: Secondary | ICD-10-CM | POA: Diagnosis not present

## 2017-02-19 NOTE — Telephone Encounter (Signed)
-----   Message from Tonny BollmanMichael Cooper, MD sent at 02/19/2017  1:57 PM EDT ----- PFO Closure: can you arrange Wednesday or Friday of my rounding week? 11am - 1pm works best for me on those weeks

## 2017-02-19 NOTE — Telephone Encounter (Signed)
Spoke with patient and Dr. Excell Seltzerooper and PFO closure is scheduled for Wednesday 8/29 at 8:30 am, patient to arrive at 6:30 am.

## 2017-02-19 NOTE — Progress Notes (Signed)
*  PRELIMINARY RESULTS* Vascular Ultrasound Transcranial Doppler with Bubbles has been completed with Dr. Pearlean BrownieSethi. High intensity transient signals (HITS) with curtain affect were heard with Valsalva maneuver release, suggestive of a large patent foramen ovale (PFO).  02/19/2017 2:19 PM Gertie FeyMichelle Idara Woodside, BS, RVT, RDCS, RDMS

## 2017-02-20 ENCOUNTER — Telehealth: Payer: Self-pay | Admitting: Neurology

## 2017-02-20 ENCOUNTER — Encounter: Payer: Self-pay | Admitting: Nurse Practitioner

## 2017-02-20 NOTE — Telephone Encounter (Signed)
Letter for work at front desk in envelope.

## 2017-02-20 NOTE — Telephone Encounter (Signed)
Reviewed pre-cath instructions with patient and scheduled him for lab work on Thursday 8/23. The patient is aware that Dr. Excell Seltzerooper has cleared him to remain out of work until 9/4. I advised him that a copy of the instructions will be placed in outgoing mail to him. I advised him to call back with questions or concerns. He verbalized understanding and agreement with plan and thanked me for the call.

## 2017-02-20 NOTE — Telephone Encounter (Signed)
Patient called office in reference to having a bubble test yesterday per patient Dr. Excell Seltzerooper is going to be preforming surgery on 03/12/17 to close the PFO.  Patient states that while at the hospital yesterday he advised him that our office will do a work note for the patient starting 02/19/17 thru 03/18/17 due to surgery.  Please call

## 2017-02-21 ENCOUNTER — Telehealth: Payer: Self-pay | Admitting: Cardiovascular Disease

## 2017-02-21 NOTE — Telephone Encounter (Signed)
Patient calling about a note for work stating that he is to be out of work from 8/8 to 9/4. Patient states that he has been out of work since he had his stroke and that he is scheduled for a PFO closure 8/29. Patient states that he has requested a note from Dr. Marlis EdelsonSethi's office but he has not heard anything from them yet. After looking in Epic, made patient aware that Dr. Marlis EdelsonSethi's office has already drafted this letter for him and have it ready for him to pick up at their office. Patient verbalized understanding and thanked me for the call.

## 2017-02-21 NOTE — Telephone Encounter (Signed)
New Message    Patient needs a note for work from 8-9 to 9-4 to return back to work, they wanted to have it wrote up before Dr Excell Seltzerooper leaves the office   Please put on mychart or email him to him

## 2017-02-25 ENCOUNTER — Telehealth: Payer: Self-pay

## 2017-02-25 NOTE — Telephone Encounter (Signed)
Rn call patient that his cardiac monitor ws negative for irregular heart beat. Continue treatment plan. Pt verbalized understanding.

## 2017-02-25 NOTE — Telephone Encounter (Signed)
-----   Message from Marvel PlanJindong Xu, MD sent at 02/25/2017  2:05 PM EDT ----- Could you please let the patient know that the heart monitoring test done recently was negative for irregular heart beat. Please continue current treatment. Thanks.  Marvel PlanJindong Xu, MD PhD Stroke Neurology 02/25/2017 2:05 PM

## 2017-03-06 ENCOUNTER — Ambulatory Visit: Payer: Commercial Managed Care - PPO | Admitting: Cardiology

## 2017-03-06 ENCOUNTER — Other Ambulatory Visit: Payer: Commercial Managed Care - PPO | Admitting: *Deleted

## 2017-03-06 DIAGNOSIS — Q211 Atrial septal defect: Secondary | ICD-10-CM

## 2017-03-06 DIAGNOSIS — Q2112 Patent foramen ovale: Secondary | ICD-10-CM

## 2017-03-06 LAB — BASIC METABOLIC PANEL
BUN / CREAT RATIO: 12 (ref 9–20)
BUN: 12 mg/dL (ref 6–24)
CHLORIDE: 101 mmol/L (ref 96–106)
CO2: 24 mmol/L (ref 20–29)
Calcium: 9.6 mg/dL (ref 8.7–10.2)
Creatinine, Ser: 1 mg/dL (ref 0.76–1.27)
GFR, EST AFRICAN AMERICAN: 108 mL/min/{1.73_m2} (ref >59)
GFR, EST NON AFRICAN AMERICAN: 94 mL/min/{1.73_m2} (ref >59)
Glucose: 108 mg/dL — ABNORMAL HIGH (ref 65–99)
POTASSIUM: 4.1 mmol/L (ref 3.5–5.2)
SODIUM: 141 mmol/L (ref 134–144)

## 2017-03-06 LAB — PROTIME-INR
INR: 1 (ref 0.8–1.2)
Prothrombin Time: 10.8 s (ref 9.1–12.0)

## 2017-03-06 LAB — CBC
Hematocrit: 48.3 % (ref 37.5–51.0)
Hemoglobin: 16.4 g/dL (ref 13.0–17.7)
MCH: 28.4 pg (ref 26.6–33.0)
MCHC: 34 g/dL (ref 31.5–35.7)
MCV: 84 fL (ref 79–97)
PLATELETS: 202 10*3/uL (ref 150–379)
RBC: 5.78 x10E6/uL (ref 4.14–5.80)
RDW: 13.7 % (ref 12.3–15.4)
WBC: 7.4 10*3/uL (ref 3.4–10.8)

## 2017-03-10 ENCOUNTER — Ambulatory Visit: Payer: Commercial Managed Care - PPO | Admitting: Neurology

## 2017-03-12 ENCOUNTER — Ambulatory Visit (HOSPITAL_COMMUNITY)
Admission: RE | Admit: 2017-03-12 | Discharge: 2017-03-12 | Disposition: A | Discharge status: Home / Self Care | Payer: Commercial Managed Care - PPO | Source: Ambulatory Visit | Attending: Cardiovascular Disease | Admitting: Cardiovascular Disease

## 2017-03-12 ENCOUNTER — Encounter (HOSPITAL_COMMUNITY)
Admission: RE | Disposition: A | Discharge status: Home / Self Care | Payer: Self-pay | Source: Ambulatory Visit | Attending: Cardiovascular Disease

## 2017-03-12 ENCOUNTER — Encounter (HOSPITAL_COMMUNITY): Payer: Self-pay | Admitting: Cardiovascular Disease

## 2017-03-12 ENCOUNTER — Ambulatory Visit (HOSPITAL_BASED_OUTPATIENT_CLINIC_OR_DEPARTMENT_OTHER): Payer: Commercial Managed Care - PPO

## 2017-03-12 DIAGNOSIS — Z8673 Personal history of transient ischemic attack (TIA), and cerebral infarction without residual deficits: Secondary | ICD-10-CM | POA: Insufficient documentation

## 2017-03-12 DIAGNOSIS — E785 Hyperlipidemia, unspecified: Secondary | ICD-10-CM | POA: Diagnosis not present

## 2017-03-12 DIAGNOSIS — Z7982 Long term (current) use of aspirin: Secondary | ICD-10-CM | POA: Insufficient documentation

## 2017-03-12 DIAGNOSIS — Q2112 Patent foramen ovale: Secondary | ICD-10-CM

## 2017-03-12 DIAGNOSIS — Z79899 Other long term (current) drug therapy: Secondary | ICD-10-CM | POA: Insufficient documentation

## 2017-03-12 DIAGNOSIS — Z7902 Long term (current) use of antithrombotics/antiplatelets: Secondary | ICD-10-CM | POA: Insufficient documentation

## 2017-03-12 DIAGNOSIS — Q211 Atrial septal defect: Secondary | ICD-10-CM | POA: Insufficient documentation

## 2017-03-12 DIAGNOSIS — I253 Aneurysm of heart: Secondary | ICD-10-CM | POA: Diagnosis not present

## 2017-03-12 HISTORY — DX: Atrial septal defect: Q21.1

## 2017-03-12 HISTORY — DX: Patent foramen ovale: Q21.12

## 2017-03-12 HISTORY — PX: PATENT FORAMEN OVALE(PFO) CLOSURE: CATH118300

## 2017-03-12 LAB — ECHOCARDIOGRAM LIMITED
CHL CUP MV DEC (S): 232
EWDT: 232 ms
FS: 27 % — AB (ref 28–44)
Height: 73 in
IV/PV OW: 1.02
LA ID, A-P, ES: 33 mm
LADIAMINDEX: 1.4 cm/m2
LDCA: 4.52 cm2
LEFT ATRIUM END SYS DIAM: 33 mm
LVOTD: 24 mm
MV pk A vel: 60.6 m/s
MVPKEVEL: 67.9 m/s
PW: 10.6 mm — AB (ref 0.6–1.1)
Weight: 3920 oz

## 2017-03-12 LAB — POCT ACTIVATED CLOTTING TIME
ACTIVATED CLOTTING TIME: 235 s
Activated Clotting Time: 169 seconds

## 2017-03-12 SURGERY — PATENT FORAMEN OVALE (PFO) CLOSURE
Anesthesia: LOCAL

## 2017-03-12 MED ORDER — MIDAZOLAM HCL 2 MG/2ML IJ SOLN
INTRAMUSCULAR | Status: DC | PRN
  Administered 2017-03-12 (×2): 2 mg via INTRAVENOUS

## 2017-03-12 MED ORDER — FENTANYL CITRATE (PF) 100 MCG/2ML IJ SOLN
INTRAMUSCULAR | Status: DC | PRN
Start: 2017-03-12 — End: 2017-03-12
  Administered 2017-03-12 (×2): 25 ug via INTRAVENOUS

## 2017-03-12 MED ORDER — FENTANYL CITRATE (PF) 100 MCG/2ML IJ SOLN
INTRAMUSCULAR | Status: AC
  Filled 2017-03-12: qty 2

## 2017-03-12 MED ORDER — SODIUM CHLORIDE 0.9% FLUSH: 3.0000 mL | INTRAVENOUS | Status: DC | PRN

## 2017-03-12 MED ORDER — ATORVASTATIN CALCIUM 40 MG PO TABS: 40.0000 mg | ORAL_TABLET | Freq: Every evening | ORAL | Status: DC

## 2017-03-12 MED ORDER — SODIUM CHLORIDE 0.9 % IV SOLN: 250.0000 mL | INTRAVENOUS | Status: DC | PRN

## 2017-03-12 MED ORDER — CEFAZOLIN SODIUM-DEXTROSE 2-4 GM/100ML-% IV SOLN
INTRAVENOUS | Status: AC
  Administered 2017-03-12: 2 g via INTRAVENOUS
  Filled 2017-03-12: qty 100

## 2017-03-12 MED ORDER — HEPARIN SODIUM (PORCINE) 1000 UNIT/ML IJ SOLN
INTRAMUSCULAR | Status: AC
  Filled 2017-03-12: qty 1

## 2017-03-12 MED ORDER — MIDAZOLAM HCL 2 MG/2ML IJ SOLN
INTRAMUSCULAR | Status: AC
  Filled 2017-03-12: qty 2

## 2017-03-12 MED ORDER — SODIUM CHLORIDE 0.9% FLUSH: 3.0000 mL | Freq: Two times a day (BID) | INTRAVENOUS | Status: DC

## 2017-03-12 MED ORDER — PSYLLIUM 95 % PO PACK: 1.0000 | PACK | Freq: Every day | ORAL | Status: DC

## 2017-03-12 MED ORDER — LIDOCAINE HCL (PF) 1 % IJ SOLN
INTRAMUSCULAR | Status: AC
Start: 2017-03-12 — End: 2017-03-12
  Filled 2017-03-12: qty 30

## 2017-03-12 MED ORDER — CEFAZOLIN SODIUM-DEXTROSE 2-4 GM/100ML-% IV SOLN
2.0000 g | INTRAVENOUS | Status: AC
  Administered 2017-03-12: 2 g via INTRAVENOUS

## 2017-03-12 MED ORDER — CLOPIDOGREL BISULFATE 75 MG PO TABS: 75.0000 mg | ORAL_TABLET | Freq: Once | ORAL | Status: DC

## 2017-03-12 MED ORDER — ASPIRIN 81 MG PO CHEW: 81.0000 mg | CHEWABLE_TABLET | ORAL | Status: DC

## 2017-03-12 MED ORDER — ASPIRIN 81 MG PO CHEW: 81.0000 mg | CHEWABLE_TABLET | ORAL | Status: AC

## 2017-03-12 MED ORDER — FLUTICASONE PROPIONATE 50 MCG/ACT NA SUSP: 1.0000 | Freq: Every day | NASAL | Status: DC

## 2017-03-12 MED ORDER — HEPARIN (PORCINE) IN NACL 2-0.9 UNIT/ML-% IJ SOLN
INTRAMUSCULAR | Status: AC
  Filled 2017-03-12: qty 1000

## 2017-03-12 MED ORDER — SODIUM CHLORIDE 0.9 % IV SOLN: INTRAVENOUS | Status: AC

## 2017-03-12 MED ORDER — HEPARIN (PORCINE) IN NACL 2-0.9 UNIT/ML-% IJ SOLN
INTRAMUSCULAR | Status: AC | PRN
  Administered 2017-03-12: 1000 mL

## 2017-03-12 MED ORDER — LIDOCAINE HCL (PF) 1 % IJ SOLN
INTRAMUSCULAR | Status: DC | PRN
  Administered 2017-03-12: 14 mL

## 2017-03-12 MED ORDER — ACETAMINOPHEN 500 MG PO TABS: 1000.0000 mg | ORAL_TABLET | Freq: Every day | ORAL | Status: DC | PRN

## 2017-03-12 MED ORDER — MIDAZOLAM HCL 2 MG/2ML IJ SOLN
INTRAMUSCULAR | Status: AC
Start: 2017-03-12 — End: 2017-03-12
  Filled 2017-03-12: qty 2

## 2017-03-12 MED ORDER — LORATADINE 10 MG PO TABS: 10.0000 mg | ORAL_TABLET | Freq: Every day | ORAL | Status: DC

## 2017-03-12 MED ORDER — CLOPIDOGREL BISULFATE 75 MG PO TABS: 75.0000 mg | ORAL_TABLET | Freq: Every evening | ORAL | Status: DC

## 2017-03-12 MED ORDER — MECLIZINE HCL 25 MG PO TABS: 25.0000 mg | ORAL_TABLET | Freq: Two times a day (BID) | ORAL | Status: DC | PRN

## 2017-03-12 MED ORDER — HEPARIN SODIUM (PORCINE) 1000 UNIT/ML IJ SOLN
INTRAMUSCULAR | Status: DC | PRN
  Administered 2017-03-12: 7000 [IU] via INTRAVENOUS

## 2017-03-12 MED ORDER — SODIUM CHLORIDE 0.9 % WEIGHT BASED INFUSION: 1.0000 mL/kg/h | INTRAVENOUS | Status: DC

## 2017-03-12 MED ORDER — ONDANSETRON HCL 4 MG/2ML IJ SOLN: 4.0000 mg | Freq: Four times a day (QID) | INTRAMUSCULAR | Status: DC | PRN

## 2017-03-12 MED ORDER — SODIUM CHLORIDE 0.9 % WEIGHT BASED INFUSION
3.0000 mL/kg/h | INTRAVENOUS | Status: AC
  Administered 2017-03-12: 3 mL/kg/h via INTRAVENOUS

## 2017-03-12 SURGICAL SUPPLY — 16 items
CATH ACUNAV REPROCESSED (CATHETERS) ×2 IMPLANT
CATH SUPER TORQUE PLUS 6F MPA1 (CATHETERS) ×2 IMPLANT
COVER PRB 48X5XTLSCP FOLD TPE (BAG) ×1 IMPLANT
COVER PROBE 5X48 (BAG) ×1
COVER SWIFTLINK CONNECTOR (BAG) ×2 IMPLANT
GUIDEWIRE AMPLATZER 1.5JX260 (WIRE) ×2 IMPLANT
GUIDEWIRE ANGLED .035X260CM (WIRE) ×2 IMPLANT
KIT HEART LEFT (KITS) ×2 IMPLANT
OCCLUDER AMPLATZER PFO 25MM (Prosthesis & Implant Heart) ×2 IMPLANT
PACK CARDIAC CATHETERIZATION (CUSTOM PROCEDURE TRAY) ×2 IMPLANT
SHEATH BRITE TIP 8FR 35CM (SHEATH) ×2 IMPLANT
SHEATH PINNACLE 8F 10CM (SHEATH) ×2 IMPLANT
SHEATH PINNACLE 9F 10CM (SHEATH) ×2 IMPLANT
SYSTEM DELIVERY AMPLATZER 8FR (SHEATH) ×2 IMPLANT
TRANSDUCER W/STOPCOCK (MISCELLANEOUS) ×2 IMPLANT
TUBING CIL FLEX 10 FLL-RA (TUBING) ×2 IMPLANT

## 2017-03-12 NOTE — H&P (Signed)
Cardiology Admission History and Physical:   Patient ID: Cody Guerrero; MRN: [id number]; DOB: 1976-06-16   Admission date: 03/12/2017  Primary Care Provider: Juanita Laster, MD Primary Cardiologist: Cody Guerrero  Chief Complaint:  PFO/Cryptogenic Stroke  Patient Profile:   Cody Guerrero is a 41 y.o. male with a history of Prior stroke diagnosed with PFO/atrial septal aneurysm presenting today for PFO closure  History of Present Illness:   Cody Guerrero as a history of small left cerebellar infarct in 2014. The patient has a history of recurrent TIA symptoms. He has not had any recurrent abnormalities on MRI testing. A TEE demonstrated a PFO with bidirectional color flow seen across the defect as well as a prominent eustachian valve. A transcranial Doppler study demonstrated a large right to left shunt. The patient is evaluated by neurology and referred for PFO closure. He has no other significant medical problems. He was initially seen in the office 01/23/2017. At that time we had extensive discussion about the risks, benefits, and alternatives to transcatheter PFO closure as well as the data supporting its use.    Past Medical History:  Diagnosis Date  . Family history of adverse reaction to anesthesia    MOTHER  . Hyperlipemia   . 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 Stroke [facility])     Past Surgical History:  Procedure Laterality Date  . HERNIA REPAIR  08/2015  . TEE WITHOUT CARDIOVERSION N/A 01/09/2017   Procedure: TRANSESOPHAGEAL ECHOCARDIOGRAM (TEE);  Surgeon: Cody Masson, MD;  Location: [facility] ENDOSCOPY;  Service: Cardiovascular;  Laterality: N/A;     Medications Prior to Admission: Prior to Admission medications   Medication Sig Start Date End Date Taking? Authorizing Provider  acetaminophen (TYLENOL) 500 MG tablet Take 1,000 mg by mouth daily as needed for moderate pain or headache.   Yes [provider]  aspirin 325 MG EC tablet Take 325 mg by mouth daily.    Yes [provider]  atorvastatin (LIPITOR) 40 MG tablet Take 40 mg by mouth every evening.    Yes [provider]  b complex vitamins tablet Take 1 tablet by mouth every evening.    Yes [provider]  clopidogrel (PLAVIX) 75 MG tablet Take 1 tablet (75 mg total) by mouth daily. Patient taking differently: Take 75 mg by mouth every evening.  01/29/17  Yes Micki Riley, MD  fexofenadine (ALLEGRA) 180 MG tablet Take 180 mg by mouth every evening.    Yes [provider]  mometasone (NASONEX) 50 MCG/ACT nasal spray Place 2 sprays into the nose every evening. 02/05/17  Yes [provider]  Omega-3 Fatty Acids (FISH OIL) 1000 MG CAPS Take 2,000 mg by mouth every evening.    Yes [provider]  psyllium (METAMUCIL) 58.6 % powder Take 1 packet by mouth every evening.    Yes [provider]  meclizine (ANTIVERT) 25 MG tablet Take 25 mg by mouth 2 (two) times daily as needed for dizziness.    [provider]     Allergies:   No Known Allergies  Social History:   Social History   Social History  . Marital status: Married    Spouse name: N/A  . Number of children: N/A  . Years of education: N/A   Occupational History  . Not on file.   Social History Main Topics  . Smoking status: Never Smoker  . Smokeless tobacco: Never Used  . Alcohol use No  . Drug use:  No  . Sexual activity: Not on file   Other Topics Concern  . Not on file   Social History Narrative   Lives at home w/ his wife and 3 kids   Right-handed   Caffeine: rare       Family History:   The patient's family history includes Breast cancer in his mother; Cancer in his maternal grandfather, maternal grandmother, and paternal grandfather; Hyperlipidemia in his father and mother.    ROS:  Please see the history of present illness.  All other ROS reviewed and negative.     Physical Exam/Data:   Vitals:   03/12/17 0644  BP: (!) 133/91  Pulse: 67    Resp: 18  Temp: 98.6 F (37 C)  TempSrc: Oral  SpO2: 98%  Weight: 245 lb (111.1 kg)  Height: 6\' 1"  (1.854 m)   No intake or output data in the 24 hours ending 03/12/17 0746 Filed Weights   03/12/17 0644  Weight: 245 lb (111.1 kg)   Body mass index is 32.32 kg/m.  General:  Well nourished, well developed, in no acute distress HEENT: normal Lymph: no adenopathy Neck: no JVD Endocrine:  No thryomegaly Vascular: No carotid bruits Cardiac:  normal S1, S2; RRR; no murmur  Lungs:  clear to auscultation bilaterally, no wheezing, rhonchi or rales  Abd: soft, nontender, no hepatomegaly  Ext: no edema Musculoskeletal:  No deformities, BUE and BLE strength normal and equal Skin: warm and dry  Neuro:  CNs 2-12 intact, no focal abnormalities noted Psych:  Normal affect    Relevant CV Studies: TEE: Study Conclusions  - Left ventricle: Systolic function was normal. The estimated ejection fraction was in the range of 55% to 60%. Wall motion was normal; there were no regional wall motion abnormalities. - Aortic valve: No evidence of vegetation. - Mitral valve: There was mild regurgitation. - Left atrium: No evidence of thrombus in the atrial cavity or appendage. No evidence of thrombus in the atrial cavity or appendage. - Right atrium: No evidence of thrombus in the atrial cavity or appendage. - Atrial septum: Prominent crista terminalis. There was a patent foramen ovale. - Tricuspid valve: There was trivial regurgitation. - Pulmonic valve: No evidence of vegetation.  Impressions:  - There is present PFO by color Doppler and bubble study. A consult to Dr Cody Guerrero for PFO closure consideration is recommended.  CT Angio Head 12/06/2016: IMPRESSION: CTA neck: Negative.  Moderate C6-7 canal stenosis.  CTA HEAD: RIGHT P3 occlusion, potentially acute given patient's symptoms.  Mild intracranial atherosclerosis, moderate RIGHT PCA involvement.  MRI  Brain: IMPRESSION: No acute posterior circulation abnormality is evident. There is a chronic infarct of the LEFT superior cerebellum.  No proximal vascular occlusion.  No acute intracranial findings. Mild subcortical white matter disease, likely chronic microvascular ischemic change.  Laboratory Data:  Chemistry Recent Labs Lab 03/06/17 0907  NA 141  K 4.1  CL 101  CO2 24  GLUCOSE 108*  BUN 12  CREATININE 1.00  CALCIUM 9.6  GFRNONAA 94  GFRAA 108    No results for input(s): PROT, ALBUMIN, AST, ALT, ALKPHOS, BILITOT in the last 168 hours. Hematology Recent Labs Lab 03/06/17 0907  WBC 7.4  RBC 5.78  HGB 16.4  HCT 48.3  MCV 84  MCH 28.4  MCHC 34.0  RDW 13.7  PLT 202   Cardiac EnzymesNo results for input(s): TROPONINI in the last 168 hours. No results for input(s): TROPIPOC in the last 168 hours.  BNPNo results for input(s):  BNP, PROBNP in the last 168 hours.  DDimer No results for input(s): DDIMER in the last 168 hours.  Radiology/Studies:  No results found.  Assessment and Plan:   PFO: Plans for transcatheter closure is outlined above. The patient is tolerating dual antiplatelet therapy with aspirin and Plavix. Risks, indications, and alternatives to PFO closure have been reviewed with the patient and his wife who understand and agree to proceed.   Enzo BiSigned, Yarelis Ambrosino, MD  03/12/2017 7:46 AM

## 2017-03-12 NOTE — Discharge Instructions (Signed)
Femoral Site Care Refer to this sheet in the next few weeks. These instructions provide you with information about caring for yourself after your procedure. Your health care provider may also give you more specific instructions. Your treatment has been planned according to current medical practices, but problems sometimes occur. Call your health care provider if you have any problems or questions after your procedure. What can I expect after the procedure? After your procedure, it is typical to have the following:  Bruising at the site that usually fades within 1-2 weeks.  Blood collecting in the tissue (hematoma) that may be painful to the touch. It should usually decrease in size and tenderness within 1-2 weeks.  Follow these instructions at home:  Take medicines only as directed by your health care provider.  You may shower 24-48 hours after the procedure or as directed by your health care provider. Remove the bandage (dressing) and gently wash the site with plain soap and water. Pat the area dry with a clean towel. Do not rub the site, because this may cause bleeding.  Do not take baths, swim, or use a hot tub until your health care provider approves.  Check your insertion site every day for redness, swelling, or drainage.  Do not apply powder or lotion to the site.  Limit use of stairs to twice a day for the first 2-3 days or as directed by your health care provider.  Do not squat for the first 2-3 days or as directed by your health care provider.  Do not lift over 10 lb (4.5 kg) for 5 days after your procedure or as directed by your health care provider.  Ask your health care provider when it is okay to: ? Return to work or school. ? Resume usual physical activities or sports. ? Resume sexual activity.  Do not drive home if you are discharged the same day as the procedure. Have someone else drive you.  You may drive 24 hours after the procedure unless otherwise  instructed by your health care provider.  Do not operate machinery or power tools for 24 hours after the procedure or as directed by your health care provider.  If your procedure was done as an outpatient procedure, which means that you went home the same day as your procedure, a responsible adult should be with you for the first 24 hours after you arrive home.  Keep all follow-up visits as directed by your health care provider. This is important. Contact a health care provider if:  You have a fever.  You have chills.  You have increased bleeding from the site. Hold pressure on the site. Get help right away if:  You have unusual pain at the site.  You have redness, warmth, or swelling at the site.  You have drainage (other than a small amount of blood on the dressing) from the site.  The site is bleeding, and the bleeding does not stop after 30 minutes of holding steady pressure on the site.  Your leg or foot becomes pale, cool, tingly, or numb. This information is not intended to replace advice given to you by your health care provider. Make sure you discuss any questions you have with your health care provider. Document Released: 03/04/2014 Document Revised: 12/07/2015 Document Reviewed: 01/18/2014 Elsevier Interactive Patient Education  2018 [employer].     Patent Foramen Ovale Closure, Care After  This sheet gives you information about how to care for yourself after your procedure. Your  health care provider may also give you more specific instructions. If you have problems or questions, contact your health care provider. What can I expect after the procedure? After the procedure, it is common to have:  Bruising.  Tenderness in the area where the long, thin tube was inserted into your inner thigh or groin area (catheter insertion site).  Follow these instructions at home: Catheter insertion site care  Follow instructions from your health care provider about how to  take care of your incision or puncture. Make sure you: ? Wash your hands with soap and water before you change your bandage (dressing). If soap and water are not available, use hand sanitizer. ? Change your dressing as told by your health care provider. ? Leave stitches (sutures), skin glue, or adhesive strips in place. These skin closures may need to stay in place for 2 weeks or longer. If adhesive strip edges start to loosen and curl up, you may trim the loose edges. Do not remove adhesive strips completely unless your health care provider tells you to do that.  Check your catheter insertion site every day for signs of infection. Check for: ? Redness, swelling, or pain. ? Fluid or blood. ? Warmth. ? Pus or a bad smell. ? A lump or bump. Activity  Do not lift anything that is heavier than 10 lb (4.5 kg), or the limit that your health care provider tells you, until he or she says that it is safe.  Return to your normal activities as told by your health care provider. Ask your health care provider what activities are safe for you.  Do not drive until your health care provider approves. Medicines  Take over-the-counter and prescription medicines only as told by your health care provider.  You may need to take medicines to prevent blood clots for 6 months or longer. You may have to take aspirin and clopidogrel. Clopidogrel is a blood thinner (anticoagulant) that helps to prevent heart attacks and strokes. Lifestyle  Avoid drinking alcohol.  Do not use any products that contain nicotine or tobacco, such as cigarettes and e-cigarettes. If you need help quitting, ask your health care provider. General instructions  Drink enough fluid to keep your urine clear or pale yellow. This helps to get rid of the dye that was used during the procedure.  Tell all health care providers and dental care providers who care for you that you had a patent foramen ovale closure. Do this before having any type  of test or surgery.  Ask your health care provider if you need to take antibiotic medicine before dental procedures and surgeries. This may be necessary to prevent infection.  While taking anticoagulants: ? Prevent falls by removing loose rugs and extension cords from areas where you walk. ? Be very careful when using knives, scissors, or other sharp objects. ? Do not play contact sports or participate in other activities that have a high risk of injury.  Do not take baths, swim, or use a hot tub until your health care provider approves.  Keep all follow-up visits as told by your health care provider. This is important. Contact a health care provider if:  You have a fever.  You have pain that does not get better with medicine.  You have fluid or blood coming from your catheter insertion site.  You have a hard lump or bump at your catheter insertion site. Get help right away if:  You have trouble breathing.  You have chest pain.  You have redness, swelling, or pain around your catheter insertion site.  Your catheter insertion site feels warm to the touch.  You have pus or a bad smell coming from your catheter insertion site.  You have severe pain in your arm or jaw. Summary  After the procedure, it is common for you to have some bruising and tenderness where a tube was inserted into your inner thigh or groin area (catheter insertion site).  Check your catheter insertion site every day for signs of infection, such as redness, swelling, or pain.  Before any procedure or test, tell all health care providers and dental providers who care for you that you had a patent foramen ovale closure. This information is not intended to replace advice given to you by your health care provider. Make sure you discuss any questions you have with your health care provider. Document Released: 08/01/2016 Document Revised: 08/01/2016 Document Reviewed: 08/01/2016 Elsevier Interactive Patient  Education  [employer]2018 Elsevier Inc.

## 2017-03-12 NOTE — Progress Notes (Signed)
  Echocardiogram 2D Echocardiogram limited has been performed.  Nolon RodBrown, Tony 03/12/2017, 2:28 PM

## 2017-03-12 NOTE — Progress Notes (Signed)
Site area: rt groin fa sheath Site Prior to Removal:  Level 0 Pressure Applied For: 30 minutes Manual:   yes Patient Status During Pull:  stable Post Pull Site:  Level 0 Post Pull Instructions Given:  yes Post Pull Pulses Present: palpable Dressing Applied:  Gauze and tegaderm Bedrest begins @ 1040 Comments:

## 2017-03-13 ENCOUNTER — Telehealth: Payer: Self-pay | Admitting: Cardiovascular Disease

## 2017-03-13 NOTE — Telephone Encounter (Signed)
New message    [employer] is calling needing dates of procedure for pt. Please call.

## 2017-03-14 ENCOUNTER — Telehealth: Payer: Self-pay | Admitting: Cardiovascular Disease

## 2017-03-14 NOTE — Telephone Encounter (Signed)
New Message   [employer] insurance wants to know how long pt will be out of work after surgery on the 29th of august. Requests a call back

## 2017-03-17 NOTE — Telephone Encounter (Signed)
We kept him out until follow up with me 9/6. Pam, if he needs any paper work filled out, I can take care of it.

## 2017-03-17 NOTE — Telephone Encounter (Signed)
Cody Guerrero - I can't recall how long we put him out of work - suspect approximately one week but I don't remember exact dates. Do you remember specifics from discharge instructions?  thx Kathlene November- [name]

## 2017-03-18 NOTE — Telephone Encounter (Signed)
Called [employer] and informed them that patient is out of work until his follow up appointment on 9/6.

## 2017-03-19 NOTE — Progress Notes (Signed)
Cardiology Office Note    Date:  03/20/2017   ID:  Cody NovakJamie C Ray, DOB 10/27/1975, MRN [id number]  PCP:  Juanita Lasteripton, [name], MD  Cardiologist:  Dr. Herbie BaltimoreHarding / Dr. Excell Seltzerooper (PFO closure)  CC: follow up s/p PFO closure   History of Present Illness:  Cody Guerrero is a 41 y.o. male with a history of HLD, previous CVA, vertebral artery stenosis, TIA and PFO s/p closure who presents to clinic for post hospital follow up.    The patient initially presented with cerebellar stroke in 2014. At that time a MRA apparently showed distal left vertebral artery stenosis. He had no issues in the interim until 11/2016 when he presented with dizziness and gait instability. He also had tingling and weakness in the left hand. MRI showed no acute findings. He was evaluation by outpatient neurology who reccommended a cardiac event monitor and TEE. The TEE showed a PFO and the patient was referred to Dr. Excell Seltzerooper for discussion of transcatheter PFO closure. It was not clear that his previous CVA and recurrent neurologic symptoms were related to his PFO. Transcranial doppler on 02/19/17 was strongly positive for R--> L shunt. It was ultimately decided that he undergo PFO closure.   He underwent successful PFO closure on 829/18 with a 25 mm Amplatzer PFO Occluder device. Post operative echo showed good device position. He was discharged home the same day on ASA/plavix.   Today he presents to clinic for follow up. No CP or SOB. No LE edema, orthopnea or PND. He has occasional dizziness but no syncope. No blood in stool or urine. No palpitations. Had a couple twinges in his chest that happened a couple days after the procedure with no recurrence. No recent stroke like symptoms.    Past Medical History:  Diagnosis Date  . Family history of adverse reaction to anesthesia    MOTHER  . Hyperlipemia   . 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 Stroke [facility](HCC)     Past Surgical History:  Procedure Laterality Date  . HERNIA  REPAIR  08/2015  . PATENT FORAMEN OVALE(PFO) CLOSURE N/A 03/12/2017   Procedure: Patent Forament Ovale(PFO) Closure;  Surgeon: Tonny Bollmanooper, Michael, MD;  [facility];  Service: Cardiovascular;  Laterality: N/A;  . TEE WITHOUT CARDIOVERSION N/A 01/09/2017   Procedure: TRANSESOPHAGEAL ECHOCARDIOGRAM (TEE);  Surgeon: Lars MassonNelson, Katarina H, MD;  [facility];  Service: Cardiovascular;  Laterality: N/A;    Current Medications: Outpatient Medications Prior to Visit  Medication Sig Dispense Refill  . acetaminophen (TYLENOL) 500 MG tablet Take 1,000 mg by mouth daily as needed for moderate pain or headache.    Marland Kitchen. aspirin 81 MG chewable tablet Chew 1 tablet (81 mg total) by mouth before cath procedure.    Marland Kitchen. atorvastatin (LIPITOR) 40 MG tablet Take 40 mg by mouth every evening.     Marland Kitchen. b complex vitamins tablet Take 1 tablet by mouth every evening.     . clopidogrel (PLAVIX) 75 MG tablet Take 1 tablet (75 mg total) by mouth daily. (Patient taking differently: Take 75 mg by mouth every evening. ) 30 tablet 11  . fexofenadine (ALLEGRA) 180 MG tablet Take 180 mg by mouth every evening.     . meclizine (ANTIVERT) 25 MG tablet Take 25 mg by mouth 2 (two) times daily as needed for dizziness.    . mometasone (NASONEX) 50 MCG/ACT nasal spray Place 2 sprays into the nose every evening.  2  . Omega-3 Fatty Acids (  FISH OIL) 1000 MG CAPS Take 2,000 mg by mouth every evening.     . psyllium (METAMUCIL) 58.6 % powder Take 1 packet by mouth every evening.      No facility-administered medications prior to visit.      Allergies:   Patient has no known allergies.   Social History   Social History  . Marital status: Married    Spouse name: N/A  . Number of children: N/A  . Years of education: N/A   Social History Main Topics  . Smoking status: Never Smoker  . Smokeless tobacco: Never Used  . Alcohol use No  . Drug use: No  . Sexual activity: Not Asked   Other Topics Concern  . None    Social History Narrative   Lives at home w/ his wife and 3 kids   Right-handed   Caffeine: rare        Family History:  The patient's family history includes Breast cancer in his mother; Cancer in his maternal grandfather, maternal grandmother, and paternal grandfather; Hyperlipidemia in his father and mother.      ROS:   Please see the history of present illness.    ROS All other systems reviewed and are negative.   PHYSICAL EXAM:   VS:  BP 102/66 (BP Location: Right Arm)   Pulse 70   Ht 6\' 1"  (1.854 m)   Wt 259 lb 12.8 oz (117.8 kg)   BMI 34.28 kg/m    GEN: Well nourished, well developed, in no acute distress  HEENT: normal  Neck: no JVD, carotid bruits, or masses Cardiac: RRR; no murmurs, rubs, or gallops,no edema  Respiratory:  clear to auscultation bilaterally, normal work of breathing GI: soft, nontender, nondistended, + BS MS: no deformity or atrophy  Skin: warm and dry, no rash Neuro:  Alert and Oriented x 3, Strength and sensation are intact Psych: euthymic mood, full affect   Wt Readings from Last 3 Encounters:  03/20/17 259 lb 12.8 oz (117.8 kg)  03/12/17 245 lb (111.1 kg)  01/29/17 262 lb 6.4 oz (119 kg)      Studies/Labs Reviewed:   EKG:  EKG is NOT ordered today.  Recent Labs: 12/06/2016: ALT 42 03/06/2017: BUN 12; Creatinine, Ser 1.00; Hemoglobin 16.4; Platelets 202; Potassium 4.1; Sodium 141   Lipid Panel    Component Value Date/Time   CHOL 130 12/07/2016 0347   TRIG 98 12/07/2016 0347   HDL 43 12/07/2016 0347   CHOLHDL 3.0 12/07/2016 0347   VLDL 20 12/07/2016 0347   LDLCALC 67 12/07/2016 0347    Additional studies/ records that were reviewed today include:  2D Echo: 12/07/16 Study Conclusions - Left ventricle: The cavity size was mildly dilated. Wall thickness was normal. Systolic function was normal. The estimated ejection fraction was in the range of 55% to 60%. Wall motion was normal; there were no regional wall motion  abnormalities. Doppler parameters are consistent with abnormal left ventricular relaxation (grade 1 diastolic dysfunction). Impressions: - Normal LV systolic function; mild LVE; probable mild diastolic dysfunction; trace MR and TR.  TEE: 01/09/17 Study Conclusions - Left ventricle: Systolic function was normal. The estimated ejection fraction was in the range of 55% to 60%. Wall motion was normal; there were no regional wall motion abnormalities. - Aortic valve: No evidence of vegetation. - Mitral valve: There was mild regurgitation. - Left atrium: No evidence of thrombus in the atrial cavity or appendage. No evidence of thrombus in the atrial cavity or appendage. -  Right atrium: No evidence of thrombus in the atrial cavity or appendage. - Atrial septum: Prominent crista terminalis. There was a patent foramen ovale. - Tricuspid valve: There was trivial regurgitation. - Pulmonic valve: No evidence of vegetation. Impressions: - There is present PFO by color Doppler and bubble study. A consult to Dr Excell Seltzer for PFO closure consideration is recommended.  Transcranial doppler 02/19/17 Summary: Strongly positive TCD Bubble study with High intensity transient signals (HITS) with curtain effect heard with Valsalva release, suggestive of a large right to left shunt likely PFO      Prepared and Electronically Authenticated by  Cardiac monitor 01/13/17    Monitoring Period: 01/13/2017 - 02/11/2017;  Normal Sinus Rhythm - Average rate ~71 bpm.  Max HR - 142 bpm (~3:52 PM) - Sinus Tachycardia  Min HR 42 bpm (along with 44 bpm) all @ ~2-3 AM - Sinus Bradycardia  2 stable events: 1 manual & 1 triggered event (dizziness - NSR noted)  No noted PACs, PVCs  No Atrial Fibrillation of Flutter  No PSVT or VT   Summary: Essentially normal study. There is evidence of nighttime/early morning sinus bradycardia which is relatively benign. Would suggest the possibility of  sleep-disordered breathing.  No evidence of atrial fibrillation/flutter or any other arrhythmia. In fact no PACs or PVCs noted.    Post PFO closure limited echo: 03/12/17 LV EF: 60% -   65% Study Conclusions - Left ventricle: The cavity size was normal. Wall thickness was   normal. Systolic function was normal. The estimated ejection   fraction was in the range of 60% to 65%. Wall motion was normal;   there were no regional wall motion abnormalities.   ASSESSMENT & PLAN:   PFO s/p PFO closure: doing well. Groin site stable. Cleared to go back to work. Continue ASA/plavix for at least 6 months. We went over need for SBE prophylaxis x 6 month if he needs dental work. We will see back in 6 months with a limited echo.   Hx of CVA: continue ASA/plavix and statin.   HLD: continue statin   Medication Adjustments/Labs and Tests Ordered: Current medicines are reviewed at length with the patient today.  Concerns regarding medicines are outlined above.  Medication changes, Labs and Tests ordered today are listed in the Patient Instructions below. Patient Instructions  Your physician recommends that you continue on your current medications as directed. Please refer to the Current Medication list given to you today.   Your physician wants you to follow-up in: 6 MONTHS WITH ECHO BEFORE  You will receive a reminder letter in the mail two months in advance. If you don't receive a letter, please call our office to schedule the follow-up appointment.    Signed, Cline Crock, PA-C  03/20/2017 3:04 PM    [facility] [address] [place], [place], [place]  [address] Phone: [phone]; Fax: [phone]

## 2017-03-20 ENCOUNTER — Encounter: Payer: Self-pay | Admitting: Physician Assistant

## 2017-03-20 ENCOUNTER — Ambulatory Visit (INDEPENDENT_AMBULATORY_CARE_PROVIDER_SITE_OTHER): Payer: Commercial Managed Care - PPO | Admitting: Physician Assistant

## 2017-03-20 VITALS — BP 102/66 | HR 70 | Ht 73.0 in | Wt 259.8 lb

## 2017-03-20 DIAGNOSIS — Q211 Atrial septal defect: Secondary | ICD-10-CM | POA: Diagnosis not present

## 2017-03-20 DIAGNOSIS — E7849 Other hyperlipidemia: Secondary | ICD-10-CM

## 2017-03-20 DIAGNOSIS — E784 Other hyperlipidemia: Secondary | ICD-10-CM

## 2017-03-20 DIAGNOSIS — Z8673 Personal history of transient ischemic attack (TIA), and cerebral infarction without residual deficits: Secondary | ICD-10-CM | POA: Diagnosis not present

## 2017-03-20 DIAGNOSIS — Q2112 Patent foramen ovale: Secondary | ICD-10-CM

## 2017-03-20 NOTE — Patient Instructions (Signed)
Your physician recommends that you continue on your current medications as directed. Please refer to the Current Medication list given to you today.   Your physician wants you to follow-up in: 6 MONTHS WITH ECHO BEFORE  You will receive a reminder letter in the mail two months in advance. If you don't receive a letter, please call our office to schedule the follow-up appointment.

## 2017-04-14 ENCOUNTER — Ambulatory Visit: Payer: Commercial Managed Care - PPO | Admitting: Neurology

## 2017-06-09 ENCOUNTER — Telehealth: Payer: Self-pay | Admitting: Cardiovascular Disease

## 2017-06-09 NOTE — Telephone Encounter (Signed)
Patient calling, states that he PFO closure and he has been having dizziness and fatigue the last couple of weeks. Patient would like to know if these side-effects of Plavix medication? Patient does not know how to address this.

## 2017-06-09 NOTE — Telephone Encounter (Signed)
Patient reports he could be fine for 2-3 weeks and then has episode of dizziness.  Could last 20 min or up to 2 days.  Has meclizine but doesn't use because it doesn't help much. Other concern is fatigue.  Notices that he can wake up and feel great and starts his day and by 8am feels fatigued.  Goes on about his day like normal.  Works everyday.    BP and HR average 130s/73, 60. Asks if related to Plavix.  States this may have been present since stroke in May, but he is really noticing the dizziness and fatigue now.   Pt is aware I will forward to Dr. Excell Seltzerooper and his cardiologist, Dr. Herbie BaltimoreHarding and their nurses. Pt reports saw his PCP last month and had lab work that was normal.  PCP was hesitant to change medications until after he spoke to/saw cardiology.  Last labs in Care Everywhere are from June.

## 2017-06-10 NOTE — Telephone Encounter (Signed)
Instructed patient to STOP PLAVIX. Reiterated to him the importance of continuing ASA 81 mg daily. He understands he will be called if Dr. Herbie BaltimoreHarding has further recommendations regarding dizziness.

## 2017-06-10 NOTE — Telephone Encounter (Signed)
Not sure symptoms related to plavix. However, he is now 3 months out from PFO closure and it is acceptable for him to stop plavix. Should continue on ASA 81 mg daily. thanks

## 2017-06-30 ENCOUNTER — Telehealth: Payer: Self-pay | Admitting: Cardiovascular Disease

## 2017-06-30 ENCOUNTER — Other Ambulatory Visit (HOSPITAL_COMMUNITY): Payer: Commercial Managed Care - PPO

## 2017-06-30 ENCOUNTER — Telehealth: Payer: Self-pay | Admitting: Neurology

## 2017-06-30 DIAGNOSIS — Q2112 Patent foramen ovale: Secondary | ICD-10-CM

## 2017-06-30 DIAGNOSIS — Q211 Atrial septal defect: Secondary | ICD-10-CM

## 2017-06-30 NOTE — Telephone Encounter (Signed)
Patient complains of intermittent fatigue and dizziness since his procedure. He originally thought it was the Plavix, but the Plavix was discontinued and the symptoms have not improved. He explains that several times a day he just gets really tired and weak- like he's "about to get the flu but the flu never happens." He reports the dizziness occurs more often then the fatigue. He says when this occurs, it doesn't feel like the room is spinning - it feels like his head is leaning sideways all the time. Per Dr. Excell Seltzerooper, scheduled patient for limited echo bubble study tomorrow. Offered to restart Plavix but he declines at this time since symptoms remained unchanged since stopping. He also has a call in to Dr. Pearlean BrownieSethi for input as well. He was grateful for call and agrees with treatment plan.

## 2017-06-30 NOTE — Telephone Encounter (Signed)
Dr. Pearlean BrownieSethi do you want to add anything on this note.

## 2017-06-30 NOTE — Telephone Encounter (Signed)
New Message  Pt call requesting to speak with RN. Pt states he is feeling dizzy and is fatigued. Pt states he was taken off plavix and feels he has gotten worse since. Pt states he is concerned and would like to speak with RN to see if he would need to make an appt.

## 2017-06-30 NOTE — Telephone Encounter (Signed)
Rn call patient about having dizzy spells, and fatigue. Pt stated he saw his PCP the first of Dec 2018 and all the labs he had came back normal. Rn could not see the current labs in care everywhere. Rn stated the fatigue will have to be manage by his PCP. [place] and [place]'t know how longer he can work. PT stated he had the PFO closure, but continue to have dizzy spells. PT stated he call Dr.Copper and they have order a echocardiogram. Pt is schedule to have that soon. Rn stated Dr. Pearlean BrownieSethi does not have any available appts this week, also he will be out of the office after next week till second week in January 2019. Pt made aware and will see what the test from Dr. Excell Seltzerooper will say.

## 2017-06-30 NOTE — Telephone Encounter (Signed)
Nurse practitioner or my partners can see him as dizziness may be new symptom unrelated to PFO closure

## 2017-06-30 NOTE — Telephone Encounter (Signed)
Pt has called with concern of his having a lot of dizzyness and fatigue.  Pt states a few months ago he had a PFO closure.  Pt was asked if he wanted to schedule an appointment, he said he would like a call back from RN 1st to see what she would suggest

## 2017-07-01 ENCOUNTER — Emergency Department (HOSPITAL_COMMUNITY): Payer: Commercial Managed Care - PPO

## 2017-07-01 ENCOUNTER — Telehealth: Payer: Self-pay | Admitting: Cardiovascular Disease

## 2017-07-01 ENCOUNTER — Emergency Department (HOSPITAL_COMMUNITY)
Admission: EM | Admit: 2017-07-01 | Discharge: 2017-07-01 | Disposition: A | Discharge status: Home / Self Care | Payer: Commercial Managed Care - PPO | Attending: Emergency Medicine | Admitting: Emergency Medicine

## 2017-07-01 ENCOUNTER — Other Ambulatory Visit: Payer: Self-pay

## 2017-07-01 ENCOUNTER — Encounter (HOSPITAL_COMMUNITY): Payer: Self-pay

## 2017-07-01 DIAGNOSIS — R42 Dizziness and giddiness: Secondary | ICD-10-CM | POA: Diagnosis present

## 2017-07-01 DIAGNOSIS — R531 Weakness: Secondary | ICD-10-CM

## 2017-07-01 DIAGNOSIS — Z79899 Other long term (current) drug therapy: Secondary | ICD-10-CM | POA: Diagnosis not present

## 2017-07-01 DIAGNOSIS — Z7982 Long term (current) use of aspirin: Secondary | ICD-10-CM | POA: Insufficient documentation

## 2017-07-01 DIAGNOSIS — R079 Chest pain, unspecified: Secondary | ICD-10-CM | POA: Insufficient documentation

## 2017-07-01 DIAGNOSIS — Z8673 Personal history of transient ischemic attack (TIA), and cerebral infarction without residual deficits: Secondary | ICD-10-CM | POA: Insufficient documentation

## 2017-07-01 LAB — BASIC METABOLIC PANEL
ANION GAP: 6 (ref 5–15)
BUN: 12 mg/dL (ref 6–20)
CALCIUM: 9.3 mg/dL (ref 8.9–10.3)
CO2: 26 mmol/L (ref 22–32)
Chloride: 106 mmol/L (ref 101–111)
Creatinine, Ser: 0.87 mg/dL (ref 0.61–1.24)
Glucose, Bld: 104 mg/dL — ABNORMAL HIGH (ref 65–99)
Potassium: 4 mmol/L (ref 3.5–5.1)
SODIUM: 138 mmol/L (ref 135–145)

## 2017-07-01 LAB — HEPATIC FUNCTION PANEL
ALBUMIN: 4.4 g/dL (ref 3.5–5.0)
ALT: 44 U/L (ref 17–63)
AST: 37 U/L (ref 15–41)
Alkaline Phosphatase: 78 U/L (ref 38–126)
Bilirubin, Direct: 0.2 mg/dL (ref 0.1–0.5)
Indirect Bilirubin: 1 mg/dL — ABNORMAL HIGH (ref 0.3–0.9)
Total Bilirubin: 1.2 mg/dL (ref 0.3–1.2)
Total Protein: 7.4 g/dL (ref 6.5–8.1)

## 2017-07-01 LAB — CBC
HCT: 47.1 % (ref 39.0–52.0)
HEMOGLOBIN: 15.9 g/dL (ref 13.0–17.0)
MCH: 29.2 pg (ref 26.0–34.0)
MCHC: 33.8 g/dL (ref 30.0–36.0)
MCV: 86.4 fL (ref 78.0–100.0)
Platelets: 202 10*3/uL (ref 150–400)
RBC: 5.45 MIL/uL (ref 4.22–5.81)
RDW: 13.3 % (ref 11.5–15.5)
WBC: 7.4 10*3/uL (ref 4.0–10.5)

## 2017-07-01 LAB — I-STAT TROPONIN, ED: TROPONIN I, POC: 0 ng/mL (ref 0.00–0.08)

## 2017-07-01 LAB — LIPASE, BLOOD: LIPASE: 19 U/L (ref 11–51)

## 2017-07-01 LAB — TSH: TSH: 1.165 u[IU]/mL (ref 0.350–4.500)

## 2017-07-01 LAB — BRAIN NATRIURETIC PEPTIDE: B NATRIURETIC PEPTIDE 5: 20.4 pg/mL (ref 0.0–100.0)

## 2017-07-01 LAB — PROTIME-INR
INR: 1
PROTHROMBIN TIME: 13.1 s (ref 11.4–15.2)

## 2017-07-01 NOTE — ED Triage Notes (Signed)
Pt reports left side chest pressure that began Saturday but became worse last night. PT endorses left arm pain and sob.

## 2017-07-01 NOTE — ED Notes (Signed)
Echo called for update on wait time, office hours stopped at 1700. MD and cardiology bedside to discuss and plan care with pt.

## 2017-07-01 NOTE — Discharge Instructions (Signed)
As discussed, your evaluation today has been largely reassuring.  But, it is important that you monitor your condition carefully, and do not hesitate to return to the ED if you develop new, or concerning changes in your condition. ? ?Otherwise, please follow-up with your physician for appropriate ongoing care. ? ?

## 2017-07-01 NOTE — ED Notes (Signed)
Pt transported to Xray from waiting, notified transport to bring pt back to room C24

## 2017-07-01 NOTE — Telephone Encounter (Signed)
Pt calls in reporting feeling worse than [facility].  Reports feeling like he "cannot get a full breath" and a cough started last night. He also reports "a feeling in my chest", "not chest pain/pressure, but like a hand is sitting there". Patient requesting to be seen today. Reviewed with Orpha BurKaty, RN.  Informed patient that we do not have any openings for him to be seen in the office today (no providers/extenders have openings).   Also informed that we could not accommodate a sooner echo bubble study before Friday, which is when he is scheduled. Pt is going to E.D. to be evaluated Orpha Bur(Katy, RN made aware).

## 2017-07-01 NOTE — ED Provider Notes (Signed)
[facility] Provider Note   CSN: [id number] Arrival date & time: 07/01/17  [id number]     History   Chief Complaint Chief Complaint  Patient presents with  . Chest Pain    HPI Cody Guerrero is a 41 y.o. male.  HPI  Patient presents with concern of lightheadedness, fatigue, dyspnea and fatigue with exertion. Onset was within the past week or so possibly 2 weeks, worse over the past day. Patient was healthy until the past few years, when he had a series of 2 strokes, and eventually was found to have a PFO. He is now status post closure of the PFO via intravascular procedure and device placement. About 2 weeks ago, the patient had a discontinuation of his Plavix.  When he continues to take aspirin. He denies focal pain, but states that he feels generally weak and lightheaded, particularly with exertion, even minimal. No new fever, chills, confusion, disorientation, speech difficulty. Patient is here with his wife who is a Engineer, civil (consulting)nurse, assists with the HPI.   Past Medical History:  Diagnosis Date  . Family history of adverse reaction to anesthesia    MOTHER  . Hyperlipemia   . 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 Stroke [facility](HCC)     Patient Active Problem List   Diagnosis Date Noted  . PFO (patent foramen ovale) 03/12/2017  . Sleep apnea 12/11/2016  . History of stroke 12/11/2016  . Hyperlipidemia 12/11/2016  . Transient cerebral ischemia   . Cerebellar infarct (HCC)   . Stroke-like episode (HCC) 12/06/2016  . Stroke-like symptom 07/11/2013    Past Surgical History:  Procedure Laterality Date  . HERNIA REPAIR  08/2015  . PATENT FORAMEN OVALE(PFO) CLOSURE N/A 03/12/2017   Procedure: Patent Forament Ovale(PFO) Closure;  Surgeon: Tonny Bollmanooper, Michael, MD;  [facility];  Service: Cardiovascular;  Laterality: N/A;  . TEE WITHOUT CARDIOVERSION N/A 01/09/2017   Procedure: TRANSESOPHAGEAL ECHOCARDIOGRAM (TEE);  Surgeon: Lars MassonNelson,  Katarina H, MD;  [facility];  Service: Cardiovascular;  Laterality: N/A;       Home Medications    Prior to Admission medications   Medication Sig Start Date End Date Taking? Authorizing Provider  acetaminophen (TYLENOL) 500 MG tablet Take 1,000 mg by mouth daily as needed for moderate pain or headache.    [provider]  aspirin 81 MG chewable tablet Chew 1 tablet (81 mg total) by mouth before cath procedure. 03/13/17   Janetta Horahompson, Kathryn R, PA-C  atorvastatin (LIPITOR) 40 MG tablet Take 40 mg by mouth every evening.     [provider]  b complex vitamins tablet Take 1 tablet by mouth every evening.     [provider]  fexofenadine (ALLEGRA) 180 MG tablet Take 180 mg by mouth every evening.     [provider]  meclizine (ANTIVERT) 25 MG tablet Take 25 mg by mouth 2 (two) times daily as needed for dizziness.    [provider]  mometasone (NASONEX) 50 MCG/ACT nasal spray Place 2 sprays into the nose every evening. 02/05/17   [provider]  Omega-3 Fatty Acids (FISH OIL) 1000 MG CAPS Take 2,000 mg by mouth every evening.     [provider]  psyllium (METAMUCIL) 58.6 % powder Take 1 packet by mouth every evening.     [provider]    Family History Family History  Problem Relation Age of Onset  . Breast cancer Mother   . Hyperlipidemia Mother   .  Hyperlipidemia Father   . Cancer Maternal Grandmother   . Cancer Maternal Grandfather   . Cancer Paternal Grandfather     Social History Social History   Tobacco Use  . Smoking status: Never Smoker  . Smokeless tobacco: Never Used  Substance Use Topics  . Alcohol use: No  . Drug use: No     Allergies   Patient has no known allergies.   Review of Systems Review of Systems  Constitutional:       Per HPI, otherwise negative  HENT:       Per HPI, otherwise negative  Respiratory:       Per HPI, otherwise negative  Cardiovascular:        Per HPI, otherwise negative  Gastrointestinal: Negative for vomiting.  Endocrine:       Negative aside from HPI  Genitourinary:       Neg aside from HPI   Musculoskeletal:       Per HPI, otherwise negative  Skin: Negative.   Neurological: Positive for light-headedness. Negative for syncope.     Physical Exam Updated Vital Signs BP 115/90   Pulse 63   Temp 98.5 F (36.9 C) (Oral)   Resp 18   Ht 6\' 1"  (1.854 m)   Wt 113.4 kg (250 lb)   SpO2 94%   BMI 32.98 kg/m   Physical Exam  Constitutional: He is oriented to person, place, and time. He appears well-developed. No distress.  HENT:  Head: Normocephalic and atraumatic.  Eyes: Conjunctivae and EOM are normal.  Cardiovascular: Normal rate and regular rhythm.  Pulmonary/Chest: Effort normal. No stridor. No respiratory distress.  Abdominal: He exhibits no distension.  Musculoskeletal: He exhibits no edema.  Neurological: He is alert and oriented to person, place, and time.  Skin: Skin is warm and dry.  Psychiatric: He has a normal mood and affect.  Nursing note and vitals reviewed.    ED Treatments / Results  Labs (all labs ordered are listed, but only abnormal results are displayed) Labs Reviewed  CBC  BASIC METABOLIC PANEL  HEPATIC FUNCTION PANEL  LIPASE, BLOOD  BRAIN NATRIURETIC PEPTIDE  PROTIME-INR  I-STAT TROPONIN, ED    EKG  EKG Interpretation  Date/Time:  Tuesday July 01 2017 09:57:19 EST Ventricular Rate:  73 PR Interval:  180 QRS Duration: 116 QT Interval:  406 QTC Calculation: 447 R Axis:   100 Text Interpretation:  Normal sinus rhythm Rightward axis T wave abnormality Abnormal ekg Confirmed by Gerhard MunchLockwood, Elizabeht Suto [phone]([id number]) on 07/01/2017 11:27:04 AM       Radiology Dg Chest 2 View  Result Date: 07/01/2017 CLINICAL DATA:  Chest tightness since yesterday. History of 2 previous CVAs. EXAM: CHEST  2 VIEW COMPARISON:  Report of a chest x-ray of May 28, 2012 FINDINGS: The lungs are adequately  inflated. The basilar lung markings are coarse. There is no alveolar infiltrate. The heart is top-normal in size. The pulmonary vascularity is not engorged. The mediastinum is normal in width. The bony thorax exhibits no acute abnormality. IMPRESSION: Mild bronchitic changes. No pneumonia, CHF, nor other acute cardiopulmonary abnormality. Electronically Signed   By: David  SwazilandJordan M.D.   On: 07/01/2017 10:57    Procedures Procedures (including critical care time)  Medications Ordered in ED Medications - No data to display   Initial Impression / Assessment and Plan / ED Course  I have reviewed the triage vital signs and the nursing notes.  Pertinent labs & imaging results that were available during my care of  the patient were reviewed by me and considered in my medical decision making (see chart for details).   Update: On repeat exam the patient is awake and alert, in no distress per Discussed initial findings with him and his wife. No evidence for ACS, pneumonia, pneumothorax, congestive heart failure. He and family are aware of all findings per Subsequently discussed patient's case with his cardiology team. Given concern for his recently placed PFO closure device, they are going to arrange a bedside echocardiogram. Disposition pending this study completion, with discharge anticipated if findings are unremarkable.   Final Clinical Impressions(s) / ED Diagnoses  weakness   Gerhard Munch, MD 07/01/17 [phone]

## 2017-07-01 NOTE — ED Notes (Signed)
Patient verbalizes understanding of discharge instructions. Opportunity for questioning and answers were provided. Armband removed by staff, pt discharged from ED ambulatory with wife.  

## 2017-07-01 NOTE — ED Provider Notes (Signed)
Please see previous physicians note regarding patient's presenting history and physical, initial ED course, and associated medical decision making.  Patient was unable to have echo with bubble study performed while here in the emergency department.  Dr. Excell Seltzerooper did see the patient.  Patient is already scheduled for echo with bubble study on Friday.  He will also arrange for exercise stress test to be performed at the cardiovascular center on Friday as well.  ED course, blood work, imaging studies are reviewed.  Overall results are very reassuring. Strict return and follow-up instructions reviewed. She expressed understanding of all discharge instructions and felt comfortable with the plan of care.    Lavera GuiseLiu, Tommey Barret Duo, MD 07/01/17 [phone]

## 2017-07-01 NOTE — Telephone Encounter (Signed)
Pt c/o of Chest Pain: STAT if CP now or developed within 24 hours  1. Are you having CP right now? yes  2. Are you experiencing any other symptoms (ex. SOB, nausea, vomiting, sweating)? SOB  3. How long have you been experiencing CP? 48 hours 4. Is your CP continuous or coming and going?  Coming and going   5. Have you taken Nitroglycerin? no ?

## 2017-07-01 NOTE — Consult Note (Signed)
Cardiology Consultation:   Patient ID: Cody Guerrero; [id number]; 11/09/1975   Admit date: 07/01/2017 Date of Consult: 07/01/2017  Primary Care Provider: Juanita Lasteripton, [name], MD Primary Cardiologist: Dr. Herbie BaltimoreHarding / Dr. Excell Seltzerooper (PFO closure)   Patient Profile:   Cody NovakJamie C Guerrero is a 41 y.o. male with a hx of HLD, previous CVA, vertebral artery stenosis, TIA and PFO s/p closure who is being seen today for the evaluation of chest pain at the request of Dr. Jeraldine LootsLockwood  History of Present Illness:   The patient initially presented with cerebellar stroke in 2014. At that time a MRA apparently showed distal left vertebral artery stenosis. He had no issues in the interim until 11/2016 when he presented with dizziness and gait instability. He also had tingling and weakness in the left hand. MRI showed no acute findings. He was evaluation by outpatient neurology who reccommended a cardiac event monitor and TEE. The TEE showed a PFO and the patient was referred to Dr. Excell Seltzerooper for discussion of transcatheter PFO closure. It was not clear that his previous CVA and recurrent neurologic symptoms were related to his PFO. However, transcranial doppler on 02/19/17 was strongly positive for R--> L shunt. It was ultimately decided that he undergo PFO closure.   He underwent successful PFO closure on 829/18 with a 25 mm Amplatzer PFO Occluder device. Post operative echo showed good device position. He was discharged home the same day on ASA/plavix.   He called the office on 06/09/17 to complain of intermittent dizziness and fatigue and wondered if it was related to the plavix. Since he was over 3 months out from his PFO closure it was decided to discontinue plavix.   He has continued to have intermittent dizziness and fatigue despite stopping plavix. He was set up for a limited echo with bubble study this upcoming Friday. He then developed left sided chest pressure and called the office for an appointment. There was no  available appointments so [facility]  Patient seen sitting comfortably in the ER. He is currently feeling okay with no active chest pain. He reports that he started noticing the chest pain last Saturday. He describes it less as a pain but more like a slight pressure like someone is "placing their hand on top of his chest." It can last minutes to hours and has no associated symptoms. Occurs up to 20 times a day. He has noticed unrelated arm pain as if his "elbow needed to be popped" as well as feeling like he can't take a deep breath in. None of the above symptoms are related to exertion. He works in [employer] and is very active on the job. He does feel like his activity is limited by fatigue sometimes but he does not notice chest pain or SOB with exertion. The fatigue and dizziness he has been having are intermittent and both come on suddenly and can last minutes to hours. He describes the dizziness as if his "head were in a fishbowl." No syncope. No LE edema, orthopnea or PND. No fevers or chills. No weakness or difficulty speaking. No recent long car rides or air travel. He does not smoke and denies a family history of early MI or CAD.    Past Medical History:  Diagnosis Date  . Family history of adverse reaction to anesthesia    MOTHER  . Hyperlipemia   . 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 Stroke [facility](HCC)     Past  Surgical History:  Procedure Laterality Date  . HERNIA REPAIR  08/2015  . PATENT FORAMEN OVALE(PFO) CLOSURE N/A 03/12/2017   Procedure: Patent Forament Ovale(PFO) Closure;  Surgeon: Tonny Bollman, MD;  Location: [facility] INVASIVE CV LAB;  Service: Cardiovascular;  Laterality: N/A;  . TEE WITHOUT CARDIOVERSION N/A 01/09/2017   Procedure: TRANSESOPHAGEAL ECHOCARDIOGRAM (TEE);  Surgeon: Lars Masson, MD;  Location: [facility] ENDOSCOPY;  Service: Cardiovascular;  Laterality: N/A;     Inpatient Medications: Scheduled Meds:  Continuous  Infusions:  PRN Meds:   Allergies:   No Known Allergies  Social History:   Social History   Socioeconomic History  . Marital status: Married    Spouse name: Not on file  . Number of children: Not on file  . Years of education: Not on file  . Highest education level: Not on file  Social Needs  . Financial resource strain: Not on file  . Food insecurity - worry: Not on file  . Food insecurity - inability: Not on file  . Transportation needs - medical: Not on file  . Transportation needs - non-medical: Not on file  Occupational History  . Not on file  Tobacco Use  . Smoking status: Never Smoker  . Smokeless tobacco: Never Used  Substance and Sexual Activity  . Alcohol use: No  . Drug use: No  . Sexual activity: Not on file  Other Topics Concern  . Not on file  Social History Narrative   Lives at home w/ his wife and 3 kids   Right-handed   Caffeine: rare    Family History:   The patient's family history includes Breast cancer in his mother; Cancer in his maternal grandfather, maternal grandmother, and paternal grandfather; Hyperlipidemia in his father and mother.  ROS:  Please see the history of present illness.  ROS  All other ROS reviewed and negative.     Physical Exam/Data:   Vitals:   07/01/17 1018 07/01/17 1122 07/01/17 1130  BP: 124/83 115/90 116/79  Pulse: 63 77 62  Resp: 18 18   Temp: 98.5 F (36.9 C)    TempSrc: Oral    SpO2: 98% 94% 96%  Weight: 250 lb (113.4 kg)    Height: 6\' 1"  (1.854 m)     No intake or output data in the 24 hours ending 07/01/17 1400 Filed Weights   07/01/17 1018  Weight: 250 lb (113.4 kg)   Body mass index is 32.98 kg/m.  General:  Well nourished, well developed, in no acute distress HEENT: normal Lymph: no adenopathy Neck: no JVD Endocrine:  No thryomegaly Vascular: No carotid bruits; FA pulses 2+ bilaterally without bruits  Cardiac:  normal S1, S2; RRR; no murmur  Lungs:  clear to auscultation bilaterally, no  wheezing, rhonchi or rales  Abd: soft, nontender, no hepatomegaly  Ext: no edema Musculoskeletal:  No deformities, BUE and BLE strength normal and equal Skin: warm and dry  Neuro:  CNs 2-12 intact, no focal abnormalities noted Psych:  Normal affect   EKG:  The EKG was personally reviewed and demonstrates: NSR with non specific T wave abnormalities similar to previous tracings Telemetry:  Telemetry was personally reviewed and demonstrates:  NSR  Relevant CV Studies: 2D Echo: 12/07/16 Study Conclusions - Left ventricle: The cavity size was mildly dilated. Wall thickness was normal. Systolic function was normal. The estimated ejection fraction was in the range of 55% to 60%. Wall motion was normal; there were no regional wall motion abnormalities. Doppler parameters are consistent with abnormal  left ventricular relaxation (grade 1 diastolic dysfunction). Impressions: - Normal LV systolic function; mild LVE; probable mild diastolic dysfunction; trace MR and TR.  TEE: 01/09/17 Study Conclusions - Left ventricle: Systolic function was normal. The estimated ejection fraction was in the range of 55% to 60%. Wall motion was normal; there were no regional wall motion abnormalities. - Aortic valve: No evidence of vegetation. - Mitral valve: There was mild regurgitation. - Left atrium: No evidence of thrombus in the atrial cavity or appendage. No evidence of thrombus in the atrial cavity or appendage. - Right atrium: No evidence of thrombus in the atrial cavity or appendage. - Atrial septum: Prominent crista terminalis. There was a patent foramen ovale. - Tricuspid valve: There was trivial regurgitation. - Pulmonic valve: No evidence of vegetation. Impressions: - There is present PFO by color Doppler and bubble study. A consult to Dr Excell Seltzerooper for PFO closure consideration is recommended.  Transcranial doppler 02/19/17 Summary: Strongly positive TCD Bubble  study with High intensity transient signals (HITS) with curtain effect heard with Valsalva release, suggestive of a large right to left shunt likely PFO Prepared and Electronically Authenticated by  Cardiac monitor 01/13/17    Monitoring Period: 01/13/2017 - 02/11/2017;  Normal Sinus Rhythm - Average rate ~71 bpm.  Max HR - 142 bpm (~3:52 PM) - Sinus Tachycardia  Min HR 42 bpm (along with 44 bpm) all @ ~2-3 AM - Sinus Bradycardia  2 stable events: 1 manual & 1 triggered event (dizziness - NSR noted)  No noted PACs, PVCs  No Atrial Fibrillation of Flutter  No PSVT or VT  Summary: Essentially normal study. There is evidence of nighttime/early morning sinus bradycardia which is relatively benign. Would suggest the possibility of sleep-disordered breathing.  No evidence of atrial fibrillation/flutter or any other arrhythmia. In fact no PACs or PVCs noted.    Post PFO closure limited echo: 03/12/17 LV EF: 60% - 65% Study Conclusions - Left ventricle: The cavity size was normal. Wall thickness was normal. Systolic function was normal. The estimated ejection fraction was in the range of 60% to 65%. Wall motion was normal; there were no regional wall motion abnormalities.    Laboratory Data:  Chemistry Recent Labs  Lab 07/01/17 1014  NA 138  K 4.0  CL 106  CO2 26  GLUCOSE 104*  BUN 12  CREATININE 0.87  CALCIUM 9.3  GFRNONAA >60  GFRAA >60  ANIONGAP 6    Recent Labs  Lab 07/01/17 1154  PROT 7.4  ALBUMIN 4.4  AST 37  ALT 44  ALKPHOS 78  BILITOT 1.2   Hematology Recent Labs  Lab 07/01/17 1014  WBC 7.4  RBC 5.45  HGB 15.9  HCT 47.1  MCV 86.4  MCH 29.2  MCHC 33.8  RDW 13.3  PLT 202   Cardiac EnzymesNo results for input(s): TROPONINI in the last 168 hours.  Recent Labs  Lab 07/01/17 1027  TROPIPOC 0.00    BNP Recent Labs  Lab 07/01/17 1155  BNP 20.4    DDimer No results for input(s): DDIMER in the last 168  hours.  Radiology/Studies:  Dg Chest 2 View  Result Date: 07/01/2017 CLINICAL DATA:  Chest tightness since yesterday. History of 2 previous CVAs. EXAM: CHEST  2 VIEW COMPARISON:  Report of a chest x-ray of May 28, 2012 FINDINGS: The lungs are adequately inflated. The basilar lung markings are coarse. There is no alveolar infiltrate. The heart is top-normal in size. The pulmonary vascularity is not engorged. The mediastinum  is normal in width. The bony thorax exhibits no acute abnormality. IMPRESSION: Mild bronchitic changes. No pneumonia, CHF, nor other acute cardiopulmonary abnormality. Electronically Signed   By: David  [place] M.D.   On: 07/01/2017 10:57    Assessment and Plan:   Chest pain: very atypical. Not related to exertion and comes and goes up to 20 times a day. There are no objective signs of ischemia. His troponin is negative and ECG non acute. Continue to monitor.   Fatigue: comes and goes. Last TSH in CareEverywhere from 01/2016. Will recheck now  Dizziness: does not sound cardiac. He says he has had issues with dizziness since his CVA in 2014 that have worsened recently  PFO s/p closure: given constellation of symptoms and recent PFO closure, we will check a repeat limited echo with bubble study. If that is unremarkable, he is cleared for discharge home. Dr. Excell Seltzer to Follow. Continue on ASA 81mg  daily.   Hx of CVA: continue ASA and statin  Signed, Cline Crock, PA-C  07/01/2017 2:00 PM   Patient seen, examined. Available data reviewed. Agree with findings, assessment, and plan as outlined by Carlean Jews, PA-C. The patient is known to me from PFO closure evaluation and procedure in August. On my exam this evening: Vitals:   07/01/17 1630 07/01/17 1730  BP: 113/74 120/74  Pulse: 61 63  Resp: 16   Temp:    SpO2: 97% 96%   Pt is alert and oriented, well=-appearing, in NAD HEENT: normal Neck: JVP - normal, carotids 2+= without bruits Lungs: CTA  bilaterally CV: RRR without murmur or gallop Abd: soft, NT, Positive BS, no hepatomegaly Ext: no C/C/E, distal pulses intact and equal Skin: warm/dry no rash  EKG is within normal limits, CXR is without acute changes. Trop (-) and BNP (-). He is non-toxic appearing on exam. His symptoms are nonspecific and include nonexertional chest pain and dizziness.   Will arrange a 2D echo limited with bubble study to assess for residual shunting and any potential mechanical complication from PFO closure. Also will check a non-imaging exercise treadmill study to further evaluate his chest pain. The patient is stable to be discharged from a cardiac perspective and his outpatient FU will be arranged. thx  Tonny Bollman, M.D. 07/01/2017 5:59 PM

## 2017-07-01 NOTE — Telephone Encounter (Signed)
New Message   Patient disconnected was complaining of shortness of breath. He was not sure if he needed to go to the emergency room. Please call.

## 2017-07-01 NOTE — ED Notes (Signed)
Cardiology @ bedside.

## 2017-07-02 NOTE — Addendum Note (Signed)
Addended by: Gunnar FusiKEMP, KATHRYN A on: 07/02/2017 01:52 PM   Modules accepted: Orders

## 2017-07-02 NOTE — Telephone Encounter (Signed)
Per consult note, "EKG is within normal limits, CXR is without acute changes. Trop (-) and BNP (-). He is non-toxic appearing on exam. His symptoms are nonspecific and include nonexertional chest pain and dizziness.   Will arrange a 2D echo limited with bubble study to assess for residual shunting and any potential mechanical complication from PFO closure. Also will check a non-imaging exercise treadmill study to further evaluate his chest pain. The patient is stable to be discharged from a cardiac perspective and his outpatient FU will be arranged. thx  Tonny BollmanMichael Cooper, M.D. 07/01/2017 5:59 PM"   GXT ordered and scheduled this Friday (same day as echo). Philomena CourseK. Thompson, PA spoke with patient and instructed him when to arrive.

## 2017-07-04 ENCOUNTER — Ambulatory Visit (INDEPENDENT_AMBULATORY_CARE_PROVIDER_SITE_OTHER): Payer: Commercial Managed Care - PPO

## 2017-07-04 ENCOUNTER — Ambulatory Visit (HOSPITAL_COMMUNITY): Payer: Commercial Managed Care - PPO | Attending: Internal Medicine

## 2017-07-04 DIAGNOSIS — Z8673 Personal history of transient ischemic attack (TIA), and cerebral infarction without residual deficits: Secondary | ICD-10-CM | POA: Diagnosis not present

## 2017-07-04 DIAGNOSIS — Q211 Atrial septal defect: Secondary | ICD-10-CM | POA: Insufficient documentation

## 2017-07-04 DIAGNOSIS — E785 Hyperlipidemia, unspecified: Secondary | ICD-10-CM | POA: Insufficient documentation

## 2017-07-04 DIAGNOSIS — R079 Chest pain, unspecified: Secondary | ICD-10-CM

## 2017-07-04 DIAGNOSIS — Q2112 Patent foramen ovale: Secondary | ICD-10-CM

## 2017-07-04 LAB — EXERCISE TOLERANCE TEST
CSEPED: 10 min
CSEPEDS: 0 s
CSEPEW: 11.6 METS
CSEPHR: 88 %
CSEPPHR: 160 {beats}/min
MPHR: 180 {beats}/min
RPE: 17
Rest HR: 59 {beats}/min

## 2017-08-19 NOTE — Progress Notes (Signed)
Tonny Bollmanooper, Michael, MD  Iona CoachBrown, Lauren W, RN        He's good - no need to FU. thanks   Previous Messages    ----- Message -----  From: Iona CoachBrown, Lauren W, RN  Sent: 08/14/2017 11:23 AM  To: Tonny BollmanMichael Cooper, MD  Subject: Any further follow-up needed??          Hey,   Mr Yetta BarreJones has a recall in for March (limited echo with bubble and OV) for 6 month post PFO closure. Due to him having issues 3 months after closure the pt had echo with bubble at that time. Does the pt require any further follow-up with us or is he now PRN? I did speak with the pt and he thought you said he did not require any further follow-up. The pt has also changed insurance and we are no longer in network. He said he would pay out of pocket to come and see you if needed.   Thanks,  Leotis ShamesLauren

## 2017-09-04 ENCOUNTER — Encounter: Payer: Self-pay | Admitting: Cardiovascular Disease

## 2018-12-08 IMAGING — CT CT ANGIO HEAD
1 of 8 series · 6 of 33 positions shown · IV contrast (OMNI 350)
Comparison: CT HEAD December 06, 2016 at 8961 hours ; and MRI/MRA head
July 12, 2013

CLINICAL DATA: Stroke, status post tPA. Dizziness, weakness of LEFT
extremities, decreased LEFT sensation. History of stroke. History of
hyperlipidemia and stroke like symptoms.

EXAM:
CT ANGIOGRAPHY HEAD AND NECK
TECHNIQUE: Multidetector CT imaging of the head and neck was performed using
the standard protocol during bolus administration of intravenous
contrast. Multiplanar CT image reconstructions and MIPs were
obtained to evaluate the vascular anatomy. Carotid stenosis
measurements (when applicable) are obtained utilizing NASCET
criteria, using the distal internal carotid diameter as the
denominator.
CONTRAST:  50 cc Isovue 370

[Series 7: cta neck axial · axial · 0.39mm/px · z∈[+15,+265]mm · 6 of 351 slices shown]
[im 51/351  soft-tissue]
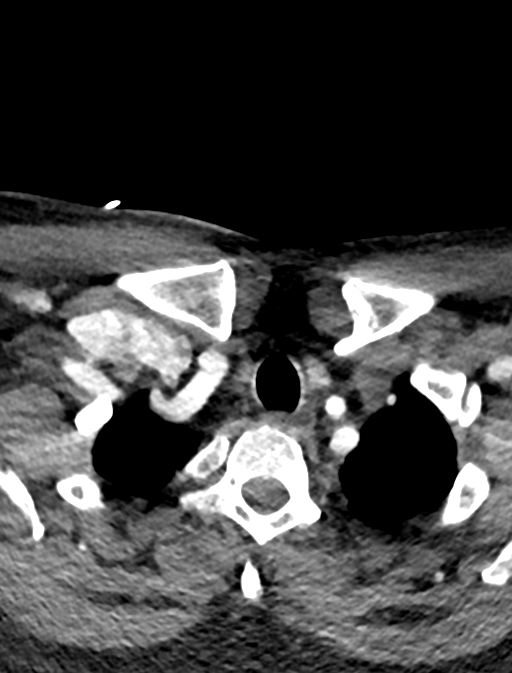
[im 101/351  bone]
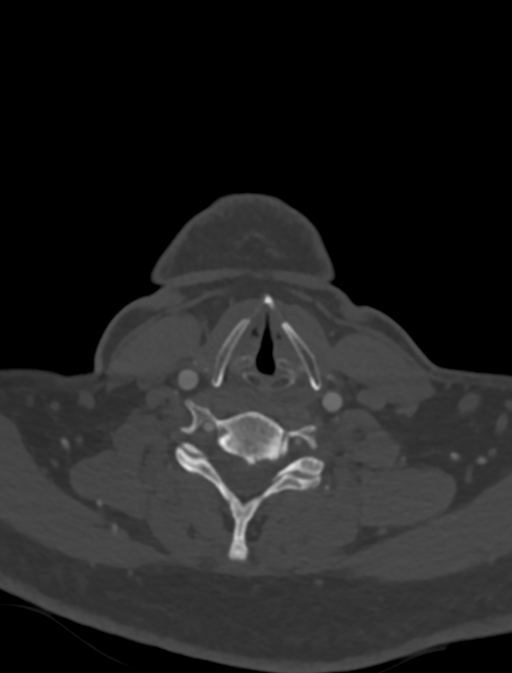
[im 151/351  soft-tissue]
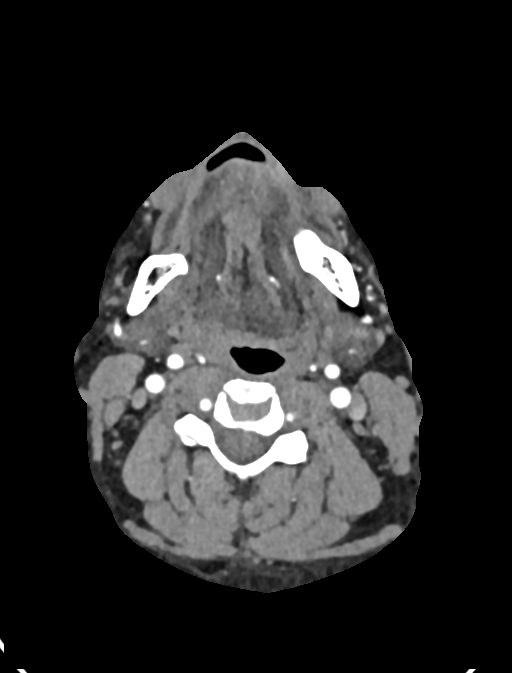
[im 201/351  bone]
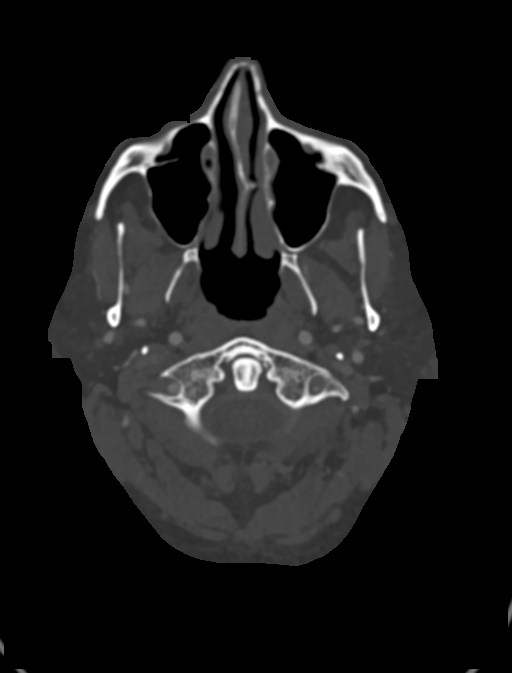
[im 251/351  soft-tissue]
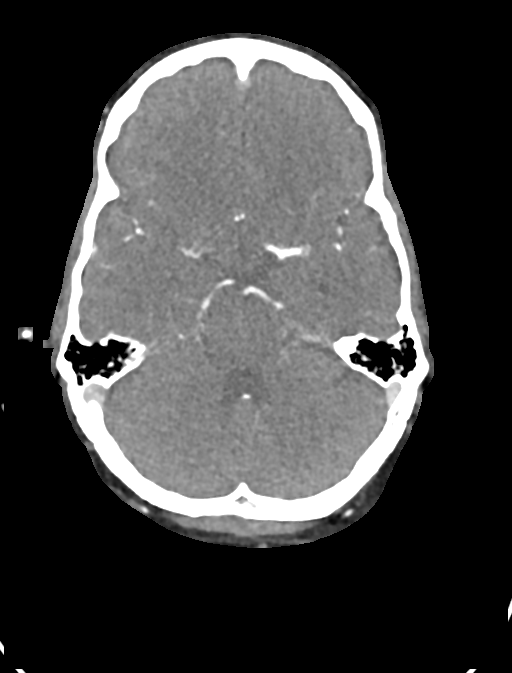
[im 301/351  bone]
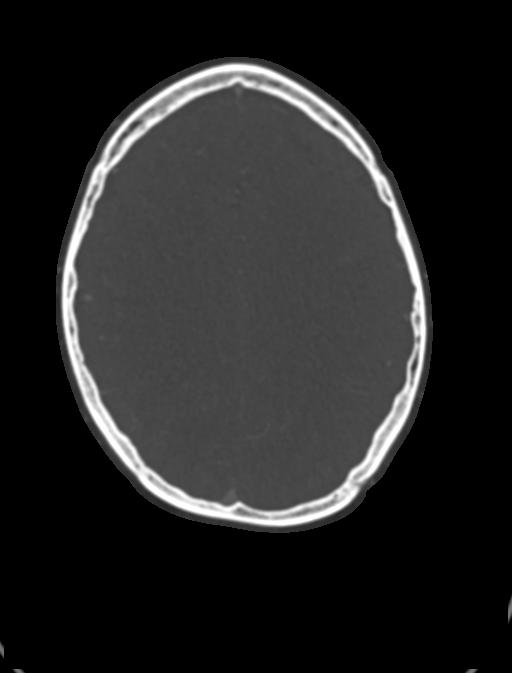

[6 of 33 positions shown; findings below may reference images not displayed]

FINDINGS: CTA NECK

AORTIC ARCH: Normal appearance of the thoracic arch, normal branch
pattern. The origins of the innominate, left Common carotid artery
and subclavian artery are widely patent.

RIGHT CAROTID SYSTEM: Common carotid artery is widely patent. Normal
appearance of the carotid bifurcation without hemodynamically
significant stenosis by NASCET criteria. Normal appearance of the
included internal carotid artery.

LEFT CAROTID SYSTEM: Common carotid artery is widely patent. Normal
appearance of the carotid bifurcation without hemodynamically
significant stenosis by NASCET criteria. Normal appearance of the
included internal carotid artery.

VERTEBRAL ARTERIES:RIGHT vertebral artery is dominant. Normal
appearance of the vertebral arteries, which appear widely patent.

SKELETON: No acute osseous process though bone windows have not been
submitted. Degenerative cervical spine resulting in moderate canal
stenosis C6-7, mild at C5-6. Absent maxillary teeth.

OTHER NECK: Soft tissues of the neck are non-acute though, not
tailored for evaluation.

CTA HEAD

ANTERIOR CIRCULATION: Patent cervical internal carotid arteries,
petrous, cavernous and supra clinoid internal carotid arteries.
Widely patent anterior communicating artery. Patent anterior and
middle cerebral arteries.

No large vessel occlusion, hemodynamically significant stenosis,
dissection, luminal irregularity, contrast extravasation or
aneurysm.

POSTERIOR CIRCULATION: Patent vertebral arteries, vertebrobasilar
junction and basilar artery, as well as main branch vessels. Patent
posterior cerebral arteries. Fetal origin RIGHT posterior cerebral
artery. Mild luminal regularity proximal bilateral cerebral
artery's. Moderate tandem stenosis RIGHT P3 segment, the inferior
RIGHT P3 segment is occluded.

No large vessel occlusion, dissection, contrast extravasation or
aneurysm.

VENOUS SINUSES: Major dural venous sinuses are patent though not
tailored for evaluation on this angiographic examination.

ANATOMIC VARIANTS: None.

DELAYED PHASE: No abnormal intracranial enhancement.

MIP images reviewed.
IMPRESSION: CTA neck:  Negative.

Moderate C6-7 canal stenosis.

CTA HEAD: RIGHT P3 occlusion, potentially acute given patient's
symptoms.

Mild intracranial atherosclerosis, moderate RIGHT PCA involvement.
